# Patient Record
Sex: Female | Born: 1955 | Race: White | Hispanic: No | Marital: Married | Smoking: Never smoker
Health system: Southern US, Community
[De-identification: ages and names within clinical notes are randomized; demographics above are authoritative.]

## PROBLEM LIST (undated history)

## (undated) DIAGNOSIS — I10 Essential (primary) hypertension: Secondary | ICD-10-CM

## (undated) DIAGNOSIS — L719 Rosacea, unspecified: Secondary | ICD-10-CM

## (undated) DIAGNOSIS — E559 Vitamin D deficiency, unspecified: Secondary | ICD-10-CM

## (undated) DIAGNOSIS — E538 Deficiency of other specified B group vitamins: Secondary | ICD-10-CM

## (undated) DIAGNOSIS — R7303 Prediabetes: Secondary | ICD-10-CM

## (undated) DIAGNOSIS — R12 Heartburn: Secondary | ICD-10-CM

## (undated) DIAGNOSIS — M1990 Unspecified osteoarthritis, unspecified site: Secondary | ICD-10-CM

## (undated) DIAGNOSIS — R011 Cardiac murmur, unspecified: Secondary | ICD-10-CM

## (undated) DIAGNOSIS — K219 Gastro-esophageal reflux disease without esophagitis: Secondary | ICD-10-CM

## (undated) DIAGNOSIS — F419 Anxiety disorder, unspecified: Secondary | ICD-10-CM

## (undated) DIAGNOSIS — R55 Syncope and collapse: Secondary | ICD-10-CM

## (undated) DIAGNOSIS — Z91018 Allergy to other foods: Secondary | ICD-10-CM

## (undated) DIAGNOSIS — E119 Type 2 diabetes mellitus without complications: Secondary | ICD-10-CM

## (undated) DIAGNOSIS — D649 Anemia, unspecified: Secondary | ICD-10-CM

## (undated) DIAGNOSIS — F32A Depression, unspecified: Secondary | ICD-10-CM

## (undated) DIAGNOSIS — E039 Hypothyroidism, unspecified: Secondary | ICD-10-CM

## (undated) DIAGNOSIS — E669 Obesity, unspecified: Secondary | ICD-10-CM

## (undated) DIAGNOSIS — K920 Hematemesis: Secondary | ICD-10-CM

## (undated) HISTORY — DX: Anxiety disorder, unspecified: F41.9

## (undated) HISTORY — DX: Hematemesis: K92.0

## (undated) HISTORY — DX: Gastro-esophageal reflux disease without esophagitis: K21.9

## (undated) HISTORY — DX: Heartburn: R12

## (undated) HISTORY — DX: Depression, unspecified: F32.A

## (undated) HISTORY — DX: Deficiency of other specified B group vitamins: E53.8

## (undated) HISTORY — DX: Allergy to other foods: Z91.018

## (undated) HISTORY — DX: Syncope and collapse: R55

## (undated) HISTORY — PX: KNEE ARTHROSCOPY: SUR90

## (undated) HISTORY — DX: Unspecified osteoarthritis, unspecified site: M19.90

## (undated) HISTORY — DX: Anemia, unspecified: D64.9

## (undated) HISTORY — DX: Vitamin D deficiency, unspecified: E55.9

## (undated) HISTORY — PX: CHOLECYSTECTOMY: SHX55

## (undated) HISTORY — DX: Type 2 diabetes mellitus without complications: E11.9

## (undated) HISTORY — PX: LAPAROSCOPIC GASTRIC BANDING: SHX1100

## (undated) HISTORY — DX: Rosacea, unspecified: L71.9

## (undated) HISTORY — DX: Obesity, unspecified: E66.9

## (undated) HISTORY — PX: ABDOMINAL HYSTERECTOMY: SHX81

---

## 1998-04-04 ENCOUNTER — Emergency Department (HOSPITAL_COMMUNITY): Admission: EM | Admit: 1998-04-04 | Discharge: 1998-04-04 | Payer: Self-pay | Admitting: Emergency Medicine

## 1998-04-09 ENCOUNTER — Ambulatory Visit (HOSPITAL_COMMUNITY): Admission: RE | Admit: 1998-04-09 | Discharge: 1998-04-10 | Payer: Self-pay | Admitting: Surgery

## 2000-01-29 ENCOUNTER — Other Ambulatory Visit: Admission: RE | Admit: 2000-01-29 | Discharge: 2000-01-29 | Payer: Self-pay | Admitting: Gynecology

## 2001-04-05 ENCOUNTER — Other Ambulatory Visit: Admission: RE | Admit: 2001-04-05 | Discharge: 2001-04-05 | Payer: Self-pay | Admitting: Gynecology

## 2002-05-04 ENCOUNTER — Other Ambulatory Visit: Admission: RE | Admit: 2002-05-04 | Discharge: 2002-05-04 | Payer: Self-pay | Admitting: Gynecology

## 2003-06-13 ENCOUNTER — Other Ambulatory Visit: Admission: RE | Admit: 2003-06-13 | Discharge: 2003-06-13 | Payer: Self-pay | Admitting: Gynecology

## 2004-04-03 ENCOUNTER — Encounter: Admission: RE | Admit: 2004-04-03 | Discharge: 2004-07-02 | Payer: Self-pay | Admitting: Endocrinology

## 2004-06-13 ENCOUNTER — Other Ambulatory Visit: Admission: RE | Admit: 2004-06-13 | Discharge: 2004-06-13 | Payer: Self-pay | Admitting: Gynecology

## 2004-08-06 ENCOUNTER — Encounter: Admission: RE | Admit: 2004-08-06 | Discharge: 2004-08-06 | Payer: Self-pay | Admitting: Endocrinology

## 2004-12-23 ENCOUNTER — Encounter: Admission: RE | Admit: 2004-12-23 | Discharge: 2004-12-23 | Payer: Self-pay | Admitting: Family Medicine

## 2005-07-23 ENCOUNTER — Other Ambulatory Visit: Admission: RE | Admit: 2005-07-23 | Discharge: 2005-07-23 | Payer: Self-pay | Admitting: Gynecology

## 2006-04-23 ENCOUNTER — Encounter: Admission: RE | Admit: 2006-04-23 | Discharge: 2006-07-22 | Payer: Self-pay | Admitting: Endocrinology

## 2006-09-30 ENCOUNTER — Other Ambulatory Visit: Admission: RE | Admit: 2006-09-30 | Discharge: 2006-09-30 | Payer: Self-pay | Admitting: Gynecology

## 2007-01-01 ENCOUNTER — Ambulatory Visit (HOSPITAL_BASED_OUTPATIENT_CLINIC_OR_DEPARTMENT_OTHER): Admission: RE | Admit: 2007-01-01 | Discharge: 2007-01-01 | Payer: Self-pay | Admitting: Specialist

## 2007-05-18 ENCOUNTER — Ambulatory Visit (HOSPITAL_COMMUNITY): Admission: RE | Admit: 2007-05-18 | Discharge: 2007-05-18 | Payer: Self-pay | Admitting: General Surgery

## 2007-05-28 ENCOUNTER — Encounter: Admission: RE | Admit: 2007-05-28 | Discharge: 2007-08-26 | Payer: Self-pay | Admitting: General Surgery

## 2007-07-19 ENCOUNTER — Ambulatory Visit (HOSPITAL_COMMUNITY): Admission: RE | Admit: 2007-07-19 | Discharge: 2007-07-19 | Payer: Self-pay | Admitting: General Surgery

## 2007-07-26 ENCOUNTER — Ambulatory Visit (HOSPITAL_COMMUNITY): Admission: RE | Admit: 2007-07-26 | Discharge: 2007-07-27 | Payer: Self-pay | Admitting: General Surgery

## 2007-10-06 ENCOUNTER — Other Ambulatory Visit: Admission: RE | Admit: 2007-10-06 | Discharge: 2007-10-06 | Payer: Self-pay | Admitting: Gynecology

## 2008-01-24 ENCOUNTER — Encounter: Admission: RE | Admit: 2008-01-24 | Discharge: 2008-01-24 | Payer: Self-pay | Admitting: General Surgery

## 2008-02-17 ENCOUNTER — Encounter: Admission: RE | Admit: 2008-02-17 | Discharge: 2008-02-17 | Payer: Self-pay | Admitting: Family Medicine

## 2008-03-31 ENCOUNTER — Ambulatory Visit: Payer: Self-pay | Admitting: Internal Medicine

## 2008-03-31 DIAGNOSIS — R05 Cough: Secondary | ICD-10-CM

## 2008-03-31 DIAGNOSIS — R059 Cough, unspecified: Secondary | ICD-10-CM | POA: Insufficient documentation

## 2008-04-10 ENCOUNTER — Telehealth: Payer: Self-pay | Admitting: Internal Medicine

## 2008-04-10 ENCOUNTER — Ambulatory Visit: Payer: Self-pay | Admitting: Internal Medicine

## 2008-04-12 ENCOUNTER — Telehealth (INDEPENDENT_AMBULATORY_CARE_PROVIDER_SITE_OTHER): Payer: Self-pay | Admitting: *Deleted

## 2008-04-18 ENCOUNTER — Ambulatory Visit: Payer: Self-pay | Admitting: Internal Medicine

## 2008-06-21 ENCOUNTER — Encounter: Payer: Self-pay | Admitting: Internal Medicine

## 2008-09-20 ENCOUNTER — Ambulatory Visit (HOSPITAL_COMMUNITY): Admission: RE | Admit: 2008-09-20 | Discharge: 2008-09-20 | Payer: Self-pay | Admitting: Gastroenterology

## 2009-06-28 ENCOUNTER — Encounter: Admission: RE | Admit: 2009-06-28 | Discharge: 2009-06-28 | Payer: Self-pay | Admitting: General Surgery

## 2010-05-16 ENCOUNTER — Ambulatory Visit: Payer: Self-pay | Admitting: Internal Medicine

## 2010-11-11 ENCOUNTER — Other Ambulatory Visit: Payer: Self-pay | Admitting: Internal Medicine

## 2010-11-11 ENCOUNTER — Ambulatory Visit
Admission: RE | Admit: 2010-11-11 | Discharge: 2010-11-11 | Payer: Self-pay | Source: Home / Self Care | Attending: Internal Medicine | Admitting: Internal Medicine

## 2010-11-11 DIAGNOSIS — R55 Syncope and collapse: Secondary | ICD-10-CM

## 2010-11-11 DIAGNOSIS — K920 Hematemesis: Secondary | ICD-10-CM | POA: Insufficient documentation

## 2010-11-11 DIAGNOSIS — K5289 Other specified noninfective gastroenteritis and colitis: Secondary | ICD-10-CM | POA: Insufficient documentation

## 2010-11-11 DIAGNOSIS — K219 Gastro-esophageal reflux disease without esophagitis: Secondary | ICD-10-CM | POA: Insufficient documentation

## 2010-11-11 DIAGNOSIS — R112 Nausea with vomiting, unspecified: Secondary | ICD-10-CM | POA: Insufficient documentation

## 2010-11-11 HISTORY — DX: Syncope and collapse: R55

## 2010-11-11 HISTORY — DX: Gastro-esophageal reflux disease without esophagitis: K21.9

## 2010-11-11 HISTORY — DX: Hematemesis: K92.0

## 2010-11-11 LAB — HEPATIC FUNCTION PANEL
ALT: 22 U/L (ref 0–35)
AST: 23 U/L (ref 0–37)
Albumin: 3.7 g/dL (ref 3.5–5.2)
Alkaline Phosphatase: 46 U/L (ref 39–117)
Bilirubin, Direct: 0.2 mg/dL (ref 0.0–0.3)
Total Bilirubin: 1.9 mg/dL — ABNORMAL HIGH (ref 0.3–1.2)
Total Protein: 7 g/dL (ref 6.0–8.3)

## 2010-11-11 LAB — CBC WITH DIFFERENTIAL/PLATELET
Basophils Absolute: 0 10*3/uL (ref 0.0–0.1)
Basophils Relative: 0.3 % (ref 0.0–3.0)
Eosinophils Absolute: 0.2 10*3/uL (ref 0.0–0.7)
Eosinophils Relative: 1.7 % (ref 0.0–5.0)
HCT: 36.6 % (ref 36.0–46.0)
Hemoglobin: 12.4 g/dL (ref 12.0–15.0)
Lymphocytes Relative: 17.6 % (ref 12.0–46.0)
Lymphs Abs: 1.8 10*3/uL (ref 0.7–4.0)
MCHC: 34 g/dL (ref 30.0–36.0)
MCV: 88.6 fl (ref 78.0–100.0)
Monocytes Absolute: 0.5 10*3/uL (ref 0.1–1.0)
Monocytes Relative: 5 % (ref 3.0–12.0)
Neutro Abs: 7.7 10*3/uL (ref 1.4–7.7)
Neutrophils Relative %: 75.4 % (ref 43.0–77.0)
Platelets: 297 10*3/uL (ref 150.0–400.0)
RBC: 4.13 Mil/uL (ref 3.87–5.11)
RDW: 14.3 % (ref 11.5–14.6)
WBC: 10.2 10*3/uL (ref 4.5–10.5)

## 2010-11-11 LAB — BASIC METABOLIC PANEL
BUN: 14 mg/dL (ref 6–23)
CO2: 28 mEq/L (ref 19–32)
Calcium: 9.8 mg/dL (ref 8.4–10.5)
Chloride: 102 mEq/L (ref 96–112)
Creatinine, Ser: 0.7 mg/dL (ref 0.4–1.2)
GFR: 98.8 mL/min (ref >60.00)
Glucose, Bld: 90 mg/dL (ref 70–99)
Potassium: 4.9 mEq/L (ref 3.5–5.1)
Sodium: 139 mEq/L (ref 135–145)

## 2010-11-18 ENCOUNTER — Other Ambulatory Visit: Payer: Self-pay | Admitting: General Surgery

## 2010-11-20 ENCOUNTER — Ambulatory Visit
Admission: RE | Admit: 2010-11-20 | Discharge: 2010-11-20 | Disposition: A | Discharge status: Home / Self Care | Payer: Self-pay | Source: Ambulatory Visit | Attending: General Surgery | Admitting: General Surgery

## 2010-11-21 NOTE — Progress Notes (Signed)
Summary: cough  Phone Note Call from Patient Call back at [phone]   Caller: Patient Call For: parrett Summary of Call: need  tramadzol hcl 50 mg for cough [facility] Initial call taken by: Rickard Patience,  April 12, 2008 8:44 AM  Follow-up for Phone Call        Spoke with pt and made aware that rx was refilled.  She was also advised to keep her followup with Dr Sherene Sires. Follow-up by: Vernie Murders,  April 12, 2008 11:33 AM      Prescriptions: TRAMADOL HCL 50 MG  TABS (TRAMADOL HCL) One to two by mouth every 4-6 hours  #40 x 0   Entered by:   Vernie Murders   Authorized by:   Nyoka Cowden MD   Signed by:   Vernie Murders on 04/12/2008   Method used:   [facility] to ...       [id number]       [address]       [place], [place]  [address]       Ph: [phone] or [phone]       Fax: [phone]   RxID:   [id number]

## 2010-11-21 NOTE — Assessment & Plan Note (Signed)
Summary: rov 1-2 wks////kp   Referred by:  Dr Angelia Mould PCP:  [facility]  Chief Complaint:  .Marland Kitchen..Marland Kitchenoffice visit......Marland KitchenGERD.......coughing...Marland KitchenMarland Kitchenfu visit.....Marland Kitchen  History of Present Illness: 74 yowf never smoker with seasonal rhinitis and asthma  and hives most in spring took allergy shots until 1996  since then controlled with allegra occ until Feb 09 came down head cold > chest with persistent intermittently prod cough since then went to doctor after one month rx abx then ct of head 02/17/08 -   max sinsitis with small af level minimal mucus prod no fever but felt a little better except for cough = worse after supper, looses voice, worse when lies down and early in am, coughs to gag and vomit.    6/12 given avelox and prednisone taper. Sx better but still coughing up green. CXR 6/12 with  increased marking RML  seen 6/23  another round of avelox/pred some better but not able to lie down even if takes 2 tramadol.  Returns 6/30 focused on her inability to cough up "fluid in lungs" not taking mucinexdm as instructed and mucus turning green intermittently, esp when lies down and early in am  Pt denies any significant sore throat, nasal congestion, fever, chills, sweats, unintended wt loss, pleuritic or exertional cp, orthopnea pnd or leg swelling.  Pt also denies any obvious fluctuation in symptoms with weather or environmental change or other alleviating or aggravating factors.            Current Allergies: ! PCN ! TETRACYCLINE ! MORPHINE ! CECLOR  Past Medical History:    Reviewed history from 03/31/2008 and no changes required:       Allergies seasonal          Past Surgical History:    Reviewed history from 03/31/2008 and no changes required:       Lap Band surgery October 2008   Family History:    Reviewed history from 03/31/2008 and no changes required:       Paternal grandfather had emphesema       Father has and "black lung"       Paternal grandmother had asthma  Social  History:    Reviewed history from 03/31/2008 and no changes required:       Works as a Print production planner       Married with one child       Never smoked       Alcohol on occ.    Review of Systems  The patient denies anorexia, fever, weight loss, weight gain, vision loss, decreased hearing, hoarseness, chest pain, syncope, peripheral edema, headaches, hemoptysis, abdominal pain, melena, hematochezia, severe indigestion/heartburn, hematuria, incontinence, muscle weakness, transient blindness, difficulty walking, depression, unusual weight change, abnormal bleeding, and enlarged lymph nodes.     Vital Signs:  Patient Profile:   55 Years Old Female Height:     71 inches Weight:      231.13 pounds BMI:     32.35 O2 Sat:      94 % O2 treatment:    Room Air Temp:     97.9 degrees F oral Pulse rate:   66 / minute BP sitting:   120 / 78  (left arm) Cuff size:   regular  Pt. in pain?   no  Vitals Entered By: Clarise Cruz Duncan Dull) (April 18, 2008 12:10 PM)                  Physical Exam  ambulatory depressed -appearing  white female in  no acute distress with frequent  throat clearing during interview and exam Afeb with normal vital signs HEENT: nl dentition,, and orophanx. Nl external ear canals without cough reflex -moderate right greater than left turbinate edema with mucopurulent secretions present. Neck without JVD/Nodes/TM Lungs clear to A and P bilaterally without cough on insp or exp maneuvers RRR no s3 or murmur or increase in P2 Abd soft and benign with nl excursion in the supine position. No bruits or organomegaly Ext warm without calf tenderness, cyanosis clubbing or edema Skin warm and dry without lesions      Pulmonary Function Test Height (in.): 71 Gender: Female    Impression & Recommendations:  Problem # 1:  COUGH (ICD-786.2) She clearly improves, though not completely after courses of abx but still has a nagging cough that causes gagging and  vomiting.  Normally I would rec adding  augmentin x 21 days to address what is likely underlying rhinitis and sinusitis but can't take either of these and developing a cyclical pattern that is not likely to be corrected until the sinus are cleared, and when/if they are would use Tussicaps to address this issue.   Each maintenance medication was reviewed in detail including most importantly the difference between maintenance and as needed and under what circumstances the prns are to be used.   ENT referral arranged.  After ent eval needs to return here for fu cxr, will place in tickel file for this purpose  Orders: ENT Referral (ENT) Est. Patient Level IV ([address])   Medications Added to Medication List This Visit: 1)  Mucinex Dm 30-600 Mg Tb12 (Dextromethorphan-guaifenesin) .... 2 every 12 hours until no longer coughing at all 2)  Methylprednisolone 4 Mg Tabs (Methylprednisolone) .... Take 4x2, 3x2 2x2 1x2 3)  Levaquin 750 Mg Tabs (Levofloxacin) .... One tablet by mouth daily x 10 days  Complete Medication List: 1)  Omeprazole 20 Mg Cpdr (Omeprazole) .... Take one 30-60 min before first and last meals of the day 2)  Reglan 10 Mg Tabs (Metoclopramide hcl) .... 1/2 three times a day before meal and 1 at bedtime 3)  Mucinex Dm 30-600 Mg Tb12 (Dextromethorphan-guaifenesin) .... 2 every 12 hours until no longer coughing at all 4)  Nasonex 50 Mcg/act Susp (Mometasone furoate) .... 2 sprays each nostril as needed 5)  Centrum Silver Tabs (Multiple vitamins-minerals) 6)  Tylenol Extra Strength 500 Mg Tabs (Acetaminophen) .... As needed 7)  Tramadol Hcl 50 Mg Tabs (Tramadol hcl) .... One to two by mouth every 4-6 hours 8)  Tessalon 200 Mg Caps (Benzonatate) .Marland Kitchen.. 1 by mouth three times a day as needed cough 9)  Methylprednisolone 4 Mg Tabs (Methylprednisolone) .... Take 4x2, 3x2 2x2 1x2 10)  Levaquin 750 Mg Tabs (Levofloxacin) .... One tablet by mouth daily x 10 days   Patient Instructions: 1)   GERD (gastroesophageal reflux disease) was discussed. It is a common cause of respiratory symptoms. It commonly presents in the absence of heartburn. GERD can be treated with medication, but also with lifestyle changes including avoidance of late meals, excessive alcohol, smoking cessation, and avoid fatty foods, chocolate, peppermint, colas, red wine, and acidic juices such as orange juice.  2)  NO MINT OR MENTHOL PRODUCTs 3)  USE SUGARLESS CANDY INSTEAD (jolley ranchers)  4)  SEE [facility] FOR REFERRAL TO ENT   Prescriptions: TRAMADOL HCL 50 MG  TABS (TRAMADOL HCL) One to two by mouth every 4-6 hours  #40 x 0   Entered and Authorized by:  Nyoka Cowden MD   Signed by:   Nyoka Cowden MD on 04/18/2008   Method used:   [facility] ...       [id number]       [address]       [place], [place]  [address]       Ph: [phone] or [phone]       Fax: [phone]   RxID:   [id number] LEVAQUIN 750 MG  TABS (LEVOFLOXACIN) One tablet by mouth daily x 10 days  #10 x 0   Entered and Authorized by:   Nyoka Cowden MD   Signed by:   Nyoka Cowden MD on 04/18/2008   Method used:   Electronically sent to ...       [id number]       [address]       [place], [place]  [address]       Ph: [phone] or [phone]       Fax: [phone]   RxID:   [id number] METHYLPREDNISOLONE 4 MG  TABS (METHYLPREDNISOLONE) take 4x2, 3x2 2x2 1x2  #20 x 0   Entered and Authorized by:   Nyoka Cowden MD   Signed by:   Nyoka Cowden MD on 04/18/2008   Method used:   Electronically sent to ...       [facility] [address]  [id number]       [address]       [place], [place]  [address]       Ph: [phone] or [phone]       Fax: [phone]   RxID:   [id number]  ]  Appended Document: rov 1-2 wks////kp fax copy to [facility]l ask pt to increase her reglan to a whole  pill ac and hs to see if this helps her gagging and vomiting - this will need to be stopped first once the cough is under control without the need for tramadol  Appended Document: rov 1-2 wks////kp Copy was faxed to [facility].  Spoke with pt and made aware to increase the reglan to 1 whole pill instread of half before meals and at bedtime.

## 2010-11-21 NOTE — Progress Notes (Signed)
Summary: cough/ rx req  Phone Note Call from Patient   Caller: Patient Call For: wert Summary of Call: pt saw mw 6/12. has a cough w/ green phlegm. [facility] [address] cell# pt. [phone]. Initial call taken by: Tivis Ringer,  April 10, 2008 9:08 AM  Follow-up for Phone Call        appt made for today at 4:00pm. Pt feels she can't wait til her appt in Friday with Wert.  Follow-up by: Reynaldo Minium CMA,  April 10, 2008 9:37 AM

## 2010-11-21 NOTE — Assessment & Plan Note (Signed)
Summary: cough/throat trouble/kcw   Referred by:  Dr Angelia Mould PCP:  [facility]  Chief Complaint:  Cough.  History of Present Illness: follow up   51 yowf never smoker with seasonal rhinitis and asthma  and hives most in spring took allergy shots until 1996  since then controlled with allegra occ until Feb 09 came down head cold > chest with persistent intermittently prod cough since then went to doctor after one month rx abx then abx then ct of head 02/17/08 -   max sinsitis with small af level minimal mucus prod no fever but felt a little better except for cough = worse after supper, looses voice, worse when lies down and early in am, coughs to gag and voimit.  6/12 given avelox and prednisone taper. Sx better but still coughing up green. CXR 6/12 with  increased marking RML,. Denies chest pain, dyspnea, orthopnea, hemoptysis, fever, n/v/d, edema. Not using tramadol on regular basis, complains that "too sleepy" , feels tired on reglan. not using mucinex dm on reg. basis either. cough still quite severe at times.       April 11, 2008 Current Meds:  OMEPRAZOLE 20 MG  CPDR (OMEPRAZOLE) Take one 30-60 min before first and last meals of the day REGLAN 10 MG  TABS (METOCLOPRAMIDE HCL) 1/2 three times a day before meal and 1 at bedtime MUCINEX DM 30-600 MG  TB12 (DEXTROMETHORPHAN-GUAIFENESIN) 1-2 every 12 hours NASONEX 50 MCG/ACT  SUSP (MOMETASONE FUROATE) 2 sprays each nostril as needed CENTRUM SILVER   TABS (MULTIPLE VITAMINS-MINERALS)  TYLENOL EXTRA STRENGTH 500 MG  TABS (ACETAMINOPHEN) as needed TRAMADOL HCL 50 MG  TABS (TRAMADOL HCL) One to two by mouth every 4-6 hours TESSALON 200 MG  CAPS (BENZONATATE) 1 by mouth three times a day as needed cough         Prior Medication List:  OMEPRAZOLE 20 MG  CPDR (OMEPRAZOLE) Take one 30-60 min before first and last meals of the day REGLAN 10 MG  TABS (METOCLOPRAMIDE HCL) By mouth before each meal and at bedtime. MUCINEX DM 30-600 MG  TB12  (DEXTROMETHORPHAN-GUAIFENESIN) 1-2 every 12 hours NASONEX 50 MCG/ACT  SUSP (MOMETASONE FUROATE) 2 sprays each nostril as needed CENTRUM SILVER   TABS (MULTIPLE VITAMINS-MINERALS)  TYLENOL EXTRA STRENGTH 500 MG  TABS (ACETAMINOPHEN) as needed TRAMADOL HCL 50 MG  TABS (TRAMADOL HCL) One to two by mouth every 4-6 hours PREDNISONE 10 MG  TABS (PREDNISONE) 4 each am x 2days, 2x2days, 1x2days and stop AVELOX 400 MG  TABS (MOXIFLOXACIN HCL) By mouth daily   Updated Prior Medication List: OMEPRAZOLE 20 MG  CPDR (OMEPRAZOLE) Take one 30-60 min before first and last meals of the day REGLAN 10 MG  TABS (METOCLOPRAMIDE HCL) 1/2 three times a day before meal and 1 at bedtime MUCINEX DM 30-600 MG  TB12 (DEXTROMETHORPHAN-GUAIFENESIN) 1-2 every 12 hours NASONEX 50 MCG/ACT  SUSP (MOMETASONE FUROATE) 2 sprays each nostril as needed CENTRUM SILVER   TABS (MULTIPLE VITAMINS-MINERALS)  TYLENOL EXTRA STRENGTH 500 MG  TABS (ACETAMINOPHEN) as needed TRAMADOL HCL 50 MG  TABS (TRAMADOL HCL) One to two by mouth every 4-6 hours TESSALON 200 MG  CAPS (BENZONATATE) 1 by mouth three times a day as needed cough  Current Allergies (reviewed today): ! PCN ! TETRACYCLINE ! MORPHINE ! 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   Risk Factors:   Review of Systems      See HPI   Vital Signs:  Patient Profile:   55 Years Old Female Weight:      235 pounds O2 Sat:      95 % O2 treatment:    Room Air Temp:     98.4 degrees F oral Pulse rate:   72 /  minute BP sitting:   134 / 88  (right arm)  Vitals Entered By: Cloyde Reams RN (April 10, 2008 4:00 PM)             Comments Pt is here today for an acute ov for productive cough, green phlegm onset 04/05/08 unable to la down at night having to sit up to sleep.  Unable to eat d/t cough causing throat irritation and nausea. Medications reviewed Completed course of prednisone and Avelox rx on 6/12/09Erika [facility] RN  April 10, 2008 4:06 PM      Physical Exam  ambulatory pleasant white female in no acute distress with frequent  throat clearing during interview and exam Afeb with normal vital signs HEENT: nl dentition,, and orophanx. Nl external ear canals without cough reflex -moderate right greater than left turbinate edema with mucopurulent secretions present. Neck without JVD/Nodes/TM Lungs clear to A and P bilaterally without cough on insp or exp maneuvers RRR no s3 or murmur or increase in P2 Abd soft and benign with nl excursion in the supine position. No bruits or organomegaly Ext warm without calf tenderness, cyanosis clubbing or edema Skin warm and dry without lesions       Impression & Recommendations:  Problem # 1:  COUGH (ICD-786.2) Slow to resolve bronchitis with cyclical cough. Possible PNA with increased marking in rml, will repeat CXR.  Avelox 400mg  once daily for 7days Mucinex DM 2 two times a day as needed cough  Tessalon Perles three times a day as needed still coughing  Tramadol 50mg  1-2 every 4 hr as needed breakthrough coughing GOAL is to be cough free and throat clearing free Sugarless candy to avoid throat clearing and coughing NO MINTS Decrease Reglan 10mg  1/2 three times a day before meal and then 1 at bedtime.  follow up Dr. Sherene Sires 1-2 weeks and as needed  Please contact office for sooner follow up if symptoms do not improve or worsen  Orders: T-2 View CXR, Same Day (71020.5TC) Est. Patient Level IV ([address])   Medications Added to Medication List This  Visit: 1)  Reglan 10 Mg Tabs (Metoclopramide hcl) .... 1/2 three times a day before meal and 1 at bedtime 2)  Tessalon 200 Mg Caps (Benzonatate) .Marland Kitchen.. 1 by mouth three times a day as needed cough   Patient Instructions: 1)  Avelox 400mg  once daily for 7days 2)  Mucinex DM 2 two times a day as needed cough  3)  Tessalon Perles three times a day as needed still coughing  4)  Tramadol 50mg  1-2 every 4  hr as needed breakthrough coughing 5)  GOAL is to be cough free and throat clearing free 6)  Sugarless candy to avoid throat clearing and coughing 7)  NO MINTS 8)  Decrease Reglan 10mg  1/2 three times a day before meal and then 1 at bedtime.  9)  follow up Dr. Sherene Sires 1-2 weeks and as needed  10)  Please contact office for sooner follow up if symptoms do not improve or worsen    Prescriptions: TESSALON 200 MG  CAPS (BENZONATATE) 1 by mouth three times a day as needed cough  #45 x 1   Entered and Authorized by:   Rubye Oaks NP   Signed by:   Tammy Parrett NP on 04/10/2008   Method used:   [facility] to ...       [id number]       [address]       [place], [place]  [address]       Ph: [phone] or [phone]       Fax: [phone]   RxID:   [id number]  ]

## 2010-11-21 NOTE — Assessment & Plan Note (Signed)
Summary: TO BE EST/NJR   Vital Signs:  Patient profile:   55 year old female Height:      68 inches Weight:      274 pounds BMI:     41.81 Temp:     98.1 degrees F oral Pulse rate:   68 / minute Pulse rhythm:   regular Resp:     16 per minute BP sitting:   120 / 70  (right arm)  Vitals Entered By: Duard Brady LPN (May 16, 2010 2:05 PM) CC: new to establish - pcp retired Is Patient Diabetic? No   Primary Care Provider:  [facility]  CC:  new to establish - pcp retired.  History of Present Illness: 55 year old patient who is seen today to establish with our practice.  Medical problems include seasonal allergic rhinitis and exogenous obesity  Preventive Screening-Counseling & Management  Alcohol-Tobacco     Smoking Status: never  Allergies: 1)  ! Pcn 2)  ! Tetracycline 3)  ! Morphine 4)  ! Ceclor  Past History:  Past Medical History: Allergies seasonal  exogenous obesity  history of type 2 diabetes  Past Surgical History: Lap Band surgery October 2008 Hysterectomy- completes 1993 Cholecystectomy- 1995 left knee arthroscopic surgery  Colonoscopy in November 2009  Family History: Reviewed history from 03/31/2008 and no changes required. Paternal grandfather had emphesema Father has and "black lung" Paternal grandmother had asthma  mother died in age 31, complications of senile dementia one brother two sisters one sister had advanced mental retardation and died of complications of cerebral vascular disease  Social History: Reviewed history from 03/31/2008 and no changes required. 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  The patient denies anorexia, fever, weight loss, weight gain, vision loss, decreased hearing, hoarseness, chest pain, syncope, dyspnea on exertion, peripheral edema, prolonged cough, headaches,  hemoptysis, abdominal pain, melena, hematochezia, hematuria, incontinence, genital sores, muscle weakness, suspicious skin lesions, transient blindness, difficulty walking, depression, unusual weight change, abnormal bleeding, enlarged lymph nodes, angioedema, and breast masses.    Physical Exam  General:  overweight-appearing.  120/72 Head:  Normocephalic and atraumatic without obvious abnormalities. No apparent alopecia or balding. Eyes:  No corneal or conjunctival inflammation noted. EOMI. Perrla. Funduscopic exam benign, without hemorrhages, exudates or papilledema. Vision grossly normal. Ears:  External ear exam shows no significant lesions or deformities.  Otoscopic examination reveals clear canals, tympanic membranes are intact bilaterally without bulging, retraction, inflammation or discharge. Hearing is grossly normal bilaterally. Nose:  External nasal examination shows no deformity or inflammation. Nasal mucosa are pink and moist without lesions or exudates. Mouth:  Oral mucosa and oropharynx without lesions or exudates.  Teeth in good repair. Neck:  No deformities, masses, or tenderness noted. Chest Wall:  No deformities, masses, or tenderness noted. Lungs:  Normal respiratory effort, chest expands symmetrically. Lungs are clear to auscultation, no crackles or wheezes. Heart:  Normal rate and regular rhythm. S1 and S2 normal without gallop, murmur, click, rub or other extra sounds. Abdomen:  Bowel sounds positive,abdomen soft and non-tender without masses, organomegaly or hernias noted. Msk:  No deformity or scoliosis noted of thoracic or lumbar spine.   Pulses:  R and L carotid,radial,femoral,dorsalis pedis and posterior tibial pulses are full and equal bilaterally Extremities:  No clubbing, cyanosis, edema, or deformity noted with normal full range of motion of all joints.  Neurologic:  alert & oriented X3, cranial nerves II-XII intact, strength normal in all extremities, and gait  normal.   Skin:  Intact without suspicious lesions or rashes Cervical Nodes:  No lymphadenopathy noted Axillary Nodes:  No palpable lymphadenopathy Inguinal Nodes:  No significant adenopathy Psych:  Cognition and judgment appear intact. Alert and cooperative with normal attention span and concentration. No apparent delusions, illusions, hallucinations   Impression & Recommendations:  Problem # 1:  Preventive Health Care (ICD-V70.0)  Complete Medication List: 1)  Nasonex 50 Mcg/act Susp (Mometasone furoate) .... 2 sprays each nostril as needed 2)  Centrum Silver Tabs (Multiple vitamins-minerals) 3)  Tylenol Extra Strength 500 Mg Tabs (Acetaminophen) .... As needed 4)  Bi-weekly Allergy Injections  5)  Furosemide 40 Mg Tabs (Furosemide) .... One daily, as needed for edema  Patient Instructions: 1)  Please schedule a follow-up appointment in 1 year. 2)  Limit your Sodium (Salt). 3)  It is important that you exercise regularly at least 20 minutes 5 times a week. If you develop chest pain, have severe difficulty breathing, or feel very tired , stop exercising immediately and seek medical attention. 4)  You need to lose weight. Consider a lower calorie diet and regular exercise.  5)  Take calcium +Vitamin D daily. Prescriptions: FUROSEMIDE 40 MG TABS (FUROSEMIDE) one daily, as needed for edema  #50 x 3   Entered and Authorized by:   Gordy Savers  MD   Signed by:   Gordy Savers  MD on 05/16/2010   Method used:   [facility]        [phone]       [address]       [place], [place]  [address]       Ph: [phone] or [phone]       Fax: [phone]   RxID:   [id number]

## 2010-11-21 NOTE — Assessment & Plan Note (Signed)
Summary: heartburn/cb   Vital Signs:  Patient profile:   55 year old female Temp:     98.3 degrees F oral BP sitting:   128 / 94  (left arm) Cuff size:   regular  Vitals Entered By: Duard Brady LPN (November 11, 2010 11:10 AM) CC: c/o GERD sx - coughing - occ blood - now noting block emesis that has a coffe ground appreance Is Patient Diabetic? No   Primary Care Provider:  Kindl  CC:  c/o GERD sx - coughing - occ blood - now noting block emesis that has a coffe ground appreance.  History of Present Illness: 55 year old patient who is approximately 3 years status post lab band surgery for a morbid obesity.  Unfortunately, she has done  poorly since the  surgery with  fairly chronic nausea, vomiting, and significant GERD  symptoms.  She is on a b.i.d. regimen of Prilosec and has had her lap band adjusted on multiple occasions.  She has had frequent episodes of low-grade coffee ground emesis.  For the past several days.  She has had more refractory nausea and vomiting and has been on clear liquids.  Yesterday morning, she had an episode of vasovagal syncope associated with the vomiting and nausea.  She has considered a LAP-BAND reversal in the past, but she was told that this would be as expensive as the original surgery, and would not be covered by insurance.   Allergies: 1)  ! Pcn 2)  ! Tetracycline 3)  ! Morphine 4)  ! Ceclor  Past History:  Past Medical History: Allergies seasonal  exogenous obesity  history of type 2 diabetes GERD  Past Surgical History: Reviewed history from 05/16/2010 and no changes required. Lap Band surgery October 2008 Hysterectomy- completes 1993 Cholecystectomy- 1995 left knee arthroscopic surgery  Colonoscopy in November 2009  Review of Systems       The patient complains of anorexia and abdominal pain.  The patient denies weight loss, weight gain, vision loss, decreased hearing, hoarseness, chest pain, syncope, dyspnea on exertion,  peripheral edema, prolonged cough, headaches, hemoptysis, melena, hematochezia, severe indigestion/heartburn, hematuria, incontinence, genital sores, muscle weakness, suspicious skin lesions, transient blindness, difficulty walking, depression, unusual weight change, abnormal bleeding, enlarged lymph nodes, angioedema, and breast masses.    Physical Exam  General:  obese no acute distress.  Blood pressure 130/90 Head:  Normocephalic and atraumatic without obvious abnormalities. No apparent alopecia or balding. Eyes:  No corneal or conjunctival inflammation noted. EOMI. Perrla. Funduscopic exam benign, without hemorrhages, exudates or papilledema. Vision grossly normal. Ears:  External ear exam shows no significant lesions or deformities.  Otoscopic examination reveals clear canals, tympanic membranes are intact bilaterally without bulging, retraction, inflammation or discharge. Hearing is grossly normal bilaterally. Mouth:  Oral mucosa and oropharynx without lesions or exudates.  Teeth in good repair. Neck:  No deformities, masses, or tenderness noted. Lungs:  Normal respiratory effort, chest expands symmetrically. Lungs are clear to auscultation, no crackles or wheezes. Heart:  Normal rate and regular rhythm. S1 and S2 normal without gallop, murmur, click, rub or other extra sounds. Abdomen:  Bowel sounds positive,abdomen soft and non-tender without masses, organomegaly or hernias noted. Msk:  No deformity or scoliosis noted of thoracic or lumbar spine.   Pulses:  R and L carotid,radial,femoral,dorsalis pedis and posterior tibial pulses are full and equal bilaterally Extremities:  No clubbing, cyanosis, edema, or deformity noted with normal full range of motion of all joints.     Impression & Recommendations:  Problem # 1:  COUGH (ICD-786.2)  Problem # 2:  HEMATEMESIS (ICD-578.0)  Orders: Venipuncture (60454) TLB-BMP (Basic Metabolic Panel-BMET) (80048-METABOL) TLB-CBC Platelet -  w/Differential (85025-CBCD) TLB-Hepatic/Liver Function Pnl (80076-HEPATIC)  Problem # 5:  VASOVAGAL SYNCOPE (ICD-780.2)  Orders: Venipuncture (09811) TLB-BMP (Basic Metabolic Panel-BMET) (80048-METABOL) TLB-CBC Platelet - w/Differential (85025-CBCD) TLB-Hepatic/Liver Function Pnl (80076-HEPATIC) Specimen Handling (91478)  Complete Medication List: 1)  Nasonex 50 Mcg/act Susp (Mometasone furoate) .... 2 sprays each nostril as needed 2)  Centrum Silver Tabs (Multiple vitamins-minerals) 3)  Tylenol Extra Strength 500 Mg Tabs (Acetaminophen) .... As needed 4)  Bi-weekly Allergy Injections  5)  Furosemide 40 Mg Tabs (Furosemide) .... One daily, as needed for edema  Patient Instructions: 1)  Avoid foods high in acid (tomatoes, citrus juices, spicy foods). Avoid eating within two hours of lying down or before exercising. Do not over eat; try smaller more frequent meals. Elevate head of bed twelve inches when sleeping. 2)  It is important that you exercise regularly at least 20 minutes 5 times a week. If you develop chest pain, have severe difficulty breathing, or feel very tired , stop exercising immediately and seek medical attention. 3)  follow up General Surgery   Orders Added: 1)  Est. Patient Level IV [29562] 2)  Venipuncture [36415] 3)  TLB-BMP (Basic Metabolic Panel-BMET) [80048-METABOL] 4)  TLB-CBC Platelet - w/Differential [85025-CBCD] 5)  TLB-Hepatic/Liver Function Pnl [80076-HEPATIC] 6)  Specimen Handling [99000]

## 2010-11-21 NOTE — Letter (Signed)
SummaryScience writer Pulmonary Care Appointment Letter  [facility]  [address]   [place], [place] [address]   Phone: [phone]  Fax: [phone]    06/21/2008 MRN: [id number]  [facility] [address] [place], [place]  [address]  Dear Kayla [name],   Our office is attempting to contact you about an appointment.  Please call our office at [phone] to schedule this appointment with Dr.________________.  Our registration staff is prepared to assist you with any questions you may have.    Thank you,   [facility]   Appended Document: [facility] Appointment Letter atc pt x 3 to sched rov with cxr per flag sent from [facility].  Letter will be mailed to pt to call and sched this appt.

## 2010-11-21 NOTE — Assessment & Plan Note (Signed)
Summary: persistant cough/ sob////kp   Visit Type:  Initial Consult Referred by:  Dr Angelia Mould PCP:  Kindl  Chief Complaint:  Pulmonary consult to evaluate persistant cough.  .  History of Present Illness: 50 yowf never smoker with seasonal rhinitis and asthma  and hives most in spring took allergy shots until 1996  since then controlled with allegra occ until Feb 09 came down head cold > chest with persistent intermittently prod cough since then went to doctor after one month rx abx then abx then ct of head 02/17/08 -  l max sinsitis with small af level minimal mucus prod no fever but felt a little better except for cough = worse after supper, looses voice, worse when lies down and early in am, coughs to gag and voimit.  Pt denies any significant sore throat, nasal congestion or excess secretions, fever, chills, sweats, unintended wt loss, pleuritic or exertional cp, orthopnea pnd or leg swelling.  Pt also denies any obvious fluctuation in symptoms with weather or environmental change or other alleviating or aggravating factors.         6/12  Current Meds:  NASONEX 50 MCG/ACT  SUSP (MOMETASONE FUROATE) 2 sprays each nostril as needed TYLENOL EXTRA STRENGTH 500 MG  TABS (ACETAMINOPHEN) as needed CENTRUM SILVER   TABS (MULTIPLE VITAMINS-MINERALS)        Current Allergies (reviewed today): ! PCN ! TETRACYCLINE ! MORPHINE ! CECLOR  Past Medical History:    Allergies seasonal       Past Surgical History:    Lap Band surgery October 2008   Family History:    Paternal grandfather had emphesema    Father has and "black lung"    Paternal grandmother had asthma  Social History:    Works as a Print production planner    Married with one child    Never smoked    Alcohol on occ.    Review of Systems  The patient denies anorexia, fever, weight loss, weight gain, vision loss, decreased hearing, hoarseness, chest pain, syncope, peripheral edema, hemoptysis, abdominal pain, melena,  hematochezia, hematuria, incontinence, muscle weakness, transient blindness, difficulty walking, depression, unusual weight change, abnormal bleeding, and enlarged lymph nodes.         intermittent severe heartburn and indigestion   Vital Signs:  Patient Profile:   55 Years Old Female Weight:      235 pounds O2 Sat:      94 % O2 treatment:    Room Air Temp:     97.7 degrees F oral Pulse rate:   86 / minute BP sitting:   110 / 58  (left arm)  Vitals Entered By: Vernie Murders (March 31, 2008 1:41 PM)                 Physical Exam  ambulatory pleasant white female in no acute distress with occasional throat clearing during interview and exam Afeb with normal vital signs HEENT: nl dentition,, and orophanx. Nl external ear canals without cough reflex -moderate right greater than left turbinate edema with mucopurulent secretions present. Neck without JVD/Nodes/TM Lungs clear to A and P bilaterally without cough on insp or exp maneuvers RRR no s3 or murmur or increase in P2 Abd soft and benign with nl excursion in the supine position. No bruits or organomegaly Ext warm without calf tenderness, cyanosis clubbing or edema Skin warm and dry without lesions     CXR  Procedure date:  02/17/2008  Findings:      increased markings  in the right middle lobe    Impression & Recommendations: Of the three most common causes of chronic cough, only one can actually cause the other two and perpetuate the cylce of cough inducing airway trauma, inflammation, heightened sensitivity to reflux which is prompted by the cough itself via a cyclical mechanism.  This may partially respond to steroids and look like asthma and post nasal drainage but never erradicated completely unless the cough and the secondary reflux are eliminated, preferably both at the same time.     In her particular case I believe what is feeling a cough his sinus disease but what is perpetuated across his reflux and she coughs  hard she gags and vomits.  I explained the nature of a cyclical cough to her emphasizing elimination of both the cough the reflux and inflammation all at once while treating the underlying sinus problem with Avelox and order a day for 5 days since she is allergic to penicillin  Medications Added to Medication List This Visit: 1)  Omeprazole 20 Mg Cpdr (Omeprazole) .... Take one 30-60 min before first and last meals of the day 2)  Reglan 10 Mg Tabs (Metoclopramide hcl) .... By mouth before each meal and at bedtime. 3)  Mucinex Dm 30-600 Mg Tb12 (Dextromethorphan-guaifenesin) .Marland Kitchen.. 1-2 every 12 hours 4)  Nasonex 50 Mcg/act Susp (Mometasone furoate) .... 2 sprays each nostril as needed 5)  Centrum Silver Tabs (Multiple vitamins-minerals) 6)  Tylenol Extra Strength 500 Mg Tabs (Acetaminophen) .... As needed 7)  Tramadol Hcl 50 Mg Tabs (Tramadol hcl) .... One to two by mouth every 4-6 hours 8)  Prednisone 10 Mg Tabs (Prednisone) .... 4 each am x 2days, 2x2days, 1x2days and stop 9)  Avelox 400 Mg Tabs (Moxifloxacin hcl) .... By mouth daily   Patient Instructions: 1)  Please schedule a follow-up appointment in 2 weeks. 2)  I emphasized that nasal steroids have no immediate benefit in terms of improving symptoms.  To help them reached the target tissue, the patient should use Afrin two puffs every 12 hours applied one min before using the nasal steroids.  Afrin should be stopped after no more than 5 days.  If the symptoms worsen, Afrin can be restarted after 5 days off of therapy to prevent rebound congestion from overuse of Afrin.  I also emphasized that in no way are nasal steroids a concern in terms of "addiction". 3)  GERD (gastroesophageal reflux disease) was discussed. It is a common cause of respiratory symptoms. It commonly presents in the absence of heartburn. GERD can be treated with medication, but also with lifestyle changes including avoidance of late meals, excessive alcohol, smoking cessation,  and avoid fatty foods, chocolate, peppermint, colas, red wine, and acidic juices such as orange juice. NO MINT OR MENTHOL PRODUCTS 4)  USE SUGARLESS CANDY INSTEAD (jolley ranchers)  5)      Prescriptions: REGLAN 10 MG  TABS (METOCLOPRAMIDE HCL) By mouth before each meal and at bedtime.  #60 x 2   Entered and Authorized by:   Nyoka Cowden MD   Signed by:   Nyoka Cowden MD on 03/31/2008   Method used:   Electronically sent to ...       [id number]       [address]       [place], [place]  [address]       Ph: [phone] or [phone]  Fax: [phone]   RxID:   [id number] TRAMADOL HCL 50 MG  TABS (TRAMADOL HCL) One to two by mouth every 4-6 hours  #40 x 0   Entered and Authorized by:   Nyoka Cowden MD   Signed by:   Nyoka Cowden MD on 03/31/2008   Method used:   Electronically sent to ...       [facility] [address]  [id number]       [address]       [place], [place]  [address]       Ph: [phone] or [phone]       Fax: [phone]   RxID:   [id number] OMEPRAZOLE 20 MG  CPDR (OMEPRAZOLE) Take one 30-60 min before first and last meals of the day  #60 x 2   Entered and Authorized by:   Nyoka Cowden MD   Signed by:   Nyoka Cowden MD on 03/31/2008   Method used:   Electronically sent to ...       [facility] [address]  [id number]       [address]       [place], [place]  [address]       Ph: [phone] or [phone]       Fax: [phone]   RxID:   [id number]  ]  Appended Document: persistant cough/ sob////kp chest x-rays suggest a right middle lobe process that may reflect a form of acquired mucocele or dysfunction as a cause of the cough.  She will need a follow-up chest x-ray on her return visit and perhaps a workup for this but do not believe allergy testing  is necessary at this point and have asked her to hold off on this for now

## 2011-03-04 NOTE — Op Note (Signed)
NAMESADIA, [name]                 ACCOUNT NO.:  [id number]   MEDICAL RECORD NO.:  [id number]          PATIENT TYPE:  OIB   LOCATION:  1621                         FACILITY:  [facility]   PHYSICIAN:  Sharlet Salina T. Hoxworth, M.D.DATE OF BIRTH:  04-05-1956   DATE OF PROCEDURE:  07/26/2007  DATE OF DISCHARGE:                               OPERATIVE REPORT   PREOPERATIVE DIAGNOSIS:  Morbid obesity.   POSTOPERATIVE DIAGNOSIS:  Morbid obesity.   SURGICAL PROCEDURES:  Placement of laparoscopic adjustable gastric band.   SURGEON:  Lorne Skeens. Hoxworth, M.D.   ASSISTANT:  Thornton Park. Daphine Deutscher, MD   ANESTHESIA:  General.   BRIEF HISTORY:  Kayla [name] is a 55 year old female with a history of  progressive morbid obesity, unresponsive to medical management.  After  extensive discussion and workup detailed elsewhere, we have elected to  proceed with placement of laparoscopic adjustable gastric band for  treatment of her morbid obesity.  She is now brought to the operating  room for this procedure.   DESCRIPTION OF OPERATION:  The patient brought to the operating room and  placed in the supine position on the operating room table; general  endotracheal anesthesia was induced.  PAS were in place.  She received  preoperative IV antibiotics and subcutaneous heparin.  The abdomen was  widely sterilely prepped and draped.  Correct patient and procedure were  verified.  Local anesthesia was used to infiltrate the trocar sites.  Through a 1 cm left subcostal incision, abdominal access was obtained  with 11 mm OptiVu trocar without difficulty; and pneumoperitoneum was  established.  Under direct vision, a 15 mm trocar was placed to the  right xiphoid through the falciform ligament; and an 11 mm trocar in the  right upper quadrant, with another 11 mm trocar just to the left of the  midline for the camera port.  Through a 5 mm subxiphoid incision, the  Nathanson  retractor was placed and the left lobe of  liver elevated with  excellent closure of the hiatus and upper stomach.  Finally, a 5 mm  trocar was placed in the left flank.  The abdomen was inspected for any  evidence of trocar injury; none was seen.  The hiatus and angle of HIS  were exposed, and the peritoneum overlying the left crus was incised.  Using the finger dissector, blunt dissection was carried down along the  left crus toward the retrogastric space.  Following this, the  gastrohepatic omentum was divided in an avascular area, and  retroperitoneal fat was retracted medially -- exposing the base of the  right crus at the area of crossing fat.  This area of the peritoneum was  incised and blunt dissection just anterior to the base of the right crus  was used to develop a retrogastric tunnel using the finger dissector.  This was then deployed up through the previously dissected area at the  angle of His without any difficulty.   Following this, a prefilled APS band system was introduced into the  abdomen and the tubing placed through the finger retractor; which was  then brought back through the retrogastric tunnel.  The band was then  brought back through this tunnel without difficulty.  The sizing tube  was introduced into the stomach and then the tubing was placed through  the buckle and the band buckled into place without any undue tension.  The sizing tube was removed.   The fundus was then imbricated up over the band to the small gastric  pouch with 3 interrupted 2-0 Ethibond sutures.  The band appeared to be  in excellent position.  The tubing was brought out through the right mid  abdominal trocar site and the [facility] retractor removed.  All CO2 was  evacuated and the trocars removed.  The right mid-abdominal incision was  lengthened slightly, and the anterior fascia cleared for placement of  the port.  Four 2-0 Prolene sutures were placed in the corresponding  areas of the anterior fascia.  The tubing was trimmed  and attached the  port; which was then sutured to the anterior chest abdominal wall with  the previously placed sutures.  The tubing was seen to curve smoothly  into the abdomen.  The subcutaneous at this site was closed with a  running 3-0 Vicryl.  Skin incisions were closed with interrupted 4-0  Monocryl and Dermabond.   Sponge, instrument and needle counts were correct.  The patient was  taken to the recovery room in good condition.      Lorne Skeens. Hoxworth, M.D.  Electronically Signed     BTH/MEDQ  D:  07/26/2007  T:  07/26/2007  Job:  [id number]

## 2011-03-04 NOTE — Op Note (Signed)
NAMECHRISTINA, Kayla [name]                 ACCOUNT NO.:  [id number]   MEDICAL RECORD NO.:  [id number]          PATIENT TYPE:  AMB   LOCATION:  ENDO                         FACILITY:  [facility]   PHYSICIAN:  Anselmo Rod, M.D.  DATE OF BIRTH:  1956-07-08   DATE OF PROCEDURE:  09/20/2008  DATE OF DISCHARGE:                               OPERATIVE REPORT   PROCEDURE PERFORMED:  Screening colonoscopy.   ENDOSCOPIST:  Anselmo Rod, M.D.   INSTRUMENT USED:  Pentax video colonoscope.   INDICATIONS FOR PROCEDURE:  A 55 year old female undergoing a screening  colonoscopy to rule out colonic polyps, masses, etc.   PREPROCEDURE PREPARATION:  Informed consent was procured from the  patient.  The patient fasted for 8 hours prior to the procedure and  prepped with a bottle of magnesium citrate and gallon of NuLYTELY the  night prior to the procedure.  Risks and benefits of the procedure  including a 10% miss rate of cancer and polyps were discussed with the  patient as well.   PREPROCEDURE PHYSICAL:  The patient had stable vital signs.  NECK:  Supple.  LUNGS:  Clear to auscultation.  S1-S2 regular.  ABDOMEN:  Soft with normal bowel sounds.   DESCRIPTION OF PROCEDURE:  The patient was placed in the left lateral  decubitus position and sedated with 100 mcg of fentanyl and 10 mg of  Versed given intravenously in slow incremental doses.  Once the patient  was adequately sedated and maintained on low-flow oxygen and continuous  cardiac monitoring, the Pentax video colonoscope was advanced from the  rectum to the cecum.  The appendiceal orifice and cecal valve were  clearly visualized and photographed.  The terminal ileum appeared  healthy without lesions.  No masses, polyps, erosions, ulcerations or  diverticula were seen.  Retroflexion in the rectum revealed no  abnormalities.   IMPRESSION:  Normal colonoscopy to the terminal ileum.  No masses,  polyps, erosions, ulcerations, or diverticula.  Normal rectum.   RECOMMENDATIONS:  1. Continue high-fiber diet with liberal fluid intake.  2. Repeat colonoscopy in the next 10 years.  3. If the patient has any abnormal symptoms in the interim of from a      GI standpoint, she is to contact the office immediately for further      recommendations.      Anselmo Rod, M.D.  Electronically Signed     JNM/MEDQ  D:  09/21/2008  T:  09/22/2008  Job:  [id number]   cc:   Gretta Cool, M.D.  Fax: [phone]

## 2011-03-07 NOTE — Op Note (Signed)
Kayla [name], Kayla [name]                 ACCOUNT NO.:  [id number]   MEDICAL RECORD NO.:  [id number]          PATIENT TYPE:  AMB   LOCATION:  [facility]                         FACILITY:  [facility]   PHYSICIAN:  Jene Every, M.D.    DATE OF BIRTH:  1956/03/22   DATE OF PROCEDURE:  01/01/2007  DATE OF DISCHARGE:                               OPERATIVE REPORT   PREOPERATIVE DIAGNOSIS:  Medial meniscus tear and degenerative joint  disease, right knee.   POSTOPERATIVE DIAGNOSIS:  Medial meniscus tear and degenerative joint  disease, right knee, grade 2 chondromalacia of the patella, medial  femoral condyle, medial tibial plateau.   PROCEDURE PERFORMED:  Right knee arthroscopy, partial medial  meniscectomy, chondroplasty of the medial femoral condyle, tibial  plateau, and patella.   BRIEF HISTORY AND INDICATIONS:  55 year old with knee pain refractory to  conservative treatment.  MRI shows meniscus tear, degenerative changes,  operative intervention is indicated for diagnosis and treatment.  The  risks and benefits were discussed including bleeding, infection, damage  to neurovascular structures, no change in symptoms, worsening of  symptoms, need for repeat debridement in the future.   SURGICAL TECHNIQUE:  With the patient in the supine position, after the  satisfactory induction of general anesthesia and 2 grams Kefzol and 500  mg vancomycin, the right lower extremity was prepped and draped in the  usual sterile fashion.  A lateral parapatellar portal and superomedial  parapatellar portal was fashioned with a #11 blade.  Ingress cannula  atraumatically placed.  Irrigant was utilized to insufflate the joint.  The patient was fairly obese, we used a lower medial parapatellar portal  due to the size of the thigh and the thigh holder.  Under direct  visualization, after insertion of the camera in the lateral portal, I  fashioned a medial parapatellar portal after localization of an 18 gauge  needle  sparing the medial meniscus.  Noted was extensive grade 3 changes  of the patella, normal patellofemoral tracking, grade 3 changes of the  medial femoral condyle and tibial plateau.  The posterior third of the  medial meniscus was torn and unstable.  I performed chondroplasty of the  patella, medial femoral condyle and medial tibial plateau with a 42 CUDA  shaver to a stable base.  Almost grade 4 changes of the medial tibial  plateau.  Straight basket rongeurs utilized through the medial  parapatellar portal to perform a partial medial meniscectomy to a stable  base.  The ACL and PCL were unremarkable.  The lateral compartment was  normal with normal femoral condyle, tibial plateau, and meniscus, stable  to palpation.  I probed the entire surface of the femoral condyle and  tibia without pathology.  I re-visited the medial compartment, again,  there were grade 3 changes noted throughout.  The remainder of the  meniscus was stable to probe palpation.  I copiously lavaged the knee,  again examined all compartments including the gutters, no residual  pathology amenable to arthroscopic intervention.  All instrumentation  was removed.  The portals were closed with 4-0 nylon simple suture.  0.25%  Marcaine with  epinephrine was infiltrated in the joint.  The wound was dressed  sterilely.  She was awakened without difficulty and transported to the  recovery room in satisfactory condition.  The patient tolerated the  procedure well without complications.      Jene Every, M.D.  Electronically Signed     JB/MEDQ  D:  01/01/2007  T:  01/02/2007  Job:  [id number]

## 2011-06-25 ENCOUNTER — Encounter: Payer: Self-pay | Admitting: Internal Medicine

## 2011-06-27 ENCOUNTER — Encounter: Payer: Self-pay | Admitting: Internal Medicine

## 2011-06-27 ENCOUNTER — Ambulatory Visit (INDEPENDENT_AMBULATORY_CARE_PROVIDER_SITE_OTHER): Payer: PRIVATE HEALTH INSURANCE | Admitting: Internal Medicine

## 2011-06-27 VITALS — BP 120/70 | HR 82 | Temp 98.4°F | Resp 20 | Ht 68.0 in | Wt 306.0 lb

## 2011-06-27 DIAGNOSIS — Z0000 Encounter for general adult medical examination without abnormal findings: Secondary | ICD-10-CM

## 2011-06-27 DIAGNOSIS — M1990 Unspecified osteoarthritis, unspecified site: Secondary | ICD-10-CM

## 2011-06-27 NOTE — Progress Notes (Signed)
Subjective:    Patient ID: Kayla [name], female    DOB: May 28, 1956, 55 y.o.   MRN: [id number]  HPI  55 year old patient who is seen today for a preoperative evaluation. She has a history of exogenous obesity. She is now worsening the right knee pain due to end-stage osteoarthritis. She has a history of gastroesophageal reflux disease controlled with OTC H2 blockers. She has no new concerns or complaints she has no restrictions other than limitations from her right knee pain she takes no chronic medications and rare furosemide for peripheral edema.  Alcohol-Tobacco  Smoking Status: never   Allergies:  1) ! Pcn  2) ! Tetracycline  3) ! Morphine  4) ! Ceclor   Past History:  Past Medical History:   Allergies seasonal  exogenous obesity  history of type 2 diabetes   Past Surgical History:  Lap Band surgery October 2008  Hysterectomy- completes 1993  Cholecystectomy- 1995  Right  knee arthroscopic surgery  Colonoscopy in November 2009  Family History:  Reviewed history from 03/31/2008 and no changes required.  Paternal grandfather had emphesema  Father has and "black lung"  Paternal grandmother had asthma  mother died in age 51, complications of senile dementia  one brother two sisters  one sister had advanced mental retardation and died of complications of cerebral vascular disease   Social History:  Reviewed history from 03/31/2008 and no changes required.  Works as a Print production planner  Married with one child  Never smoked  Alcohol on occ.Smoking Status: never    Review of Systems  Constitutional: Negative.   HENT: Negative for hearing loss, congestion, sore throat, rhinorrhea, dental problem, sinus pressure and tinnitus.   Eyes: Negative for pain, discharge and visual disturbance.  Respiratory: Negative for cough and shortness of breath.   Cardiovascular: Negative for chest pain, palpitations and leg swelling.  Gastrointestinal: Negative for nausea, vomiting,  abdominal pain, diarrhea, constipation, blood in stool and abdominal distention.  Genitourinary: Negative for dysuria, urgency, frequency, hematuria, flank pain, vaginal bleeding, vaginal discharge, difficulty urinating, vaginal pain and pelvic pain.  Musculoskeletal: Positive for joint swelling and gait problem. Negative for arthralgias.  Skin: Negative for rash.  Neurological: Negative for dizziness, syncope, speech difficulty, weakness, numbness and headaches.  Hematological: Negative for adenopathy.  Psychiatric/Behavioral: Negative for behavioral problems, dysphoric mood and agitation. The patient is not nervous/anxious.        Objective:   Physical Exam  Constitutional: She is oriented to person, place, and time. She appears well-developed and well-nourished. No distress.       Weight 306 blood pressure 120/78   HENT:  Head: Normocephalic.  Right Ear: External ear normal.  Left Ear: External ear normal.  Mouth/Throat: Oropharynx is clear and moist.  Eyes: Conjunctivae and EOM are normal. Pupils are equal, round, and reactive to light.  Neck: Normal range of motion. Neck supple. No thyromegaly present.  Cardiovascular: Normal rate, regular rhythm, normal heart sounds and intact distal pulses.   Pulmonary/Chest: Effort normal and breath sounds normal.  Abdominal: Soft. Bowel sounds are normal. She exhibits no mass. There is no tenderness.  Musculoskeletal: Normal range of motion.  Lymphadenopathy:    She has no cervical adenopathy.  Neurological: She is alert and oriented to person, place, and time.  Skin: Skin is warm and dry. No rash noted.  Psychiatric: She has a normal mood and affect. Her behavior is normal.          Assessment & Plan:  End-stage right knee osteoarthritis Exogenous obesity  Medically stable for surgery. Forms completed and faxed

## 2011-06-27 NOTE — Patient Instructions (Signed)
Limit your sodium (Salt) intake    It is important that you exercise regularly, at least 20 minutes 3 to 4 times per week.  If you develop chest pain or shortness of breath seek  medical attention.  You need to lose weight.  Consider a lower calorie diet and regular exercise. 

## 2011-07-21 HISTORY — PX: TOTAL KNEE ARTHROPLASTY: SHX125

## 2011-07-31 LAB — URINALYSIS, ROUTINE W REFLEX MICROSCOPIC
Bilirubin Urine: NEGATIVE
Glucose, UA: NEGATIVE
Hgb urine dipstick: NEGATIVE
Ketones, ur: NEGATIVE
Nitrite: NEGATIVE
Protein, ur: NEGATIVE
Specific Gravity, Urine: 1.017
Urobilinogen, UA: 0.2
pH: 7

## 2011-07-31 LAB — BASIC METABOLIC PANEL
BUN: 22
CO2: 26
Calcium: 10.2
Chloride: 103
Creatinine, Ser: 0.86
GFR calc Af Amer: >60
GFR calc non Af Amer: >60
Glucose, Bld: 99
Potassium: 3.8
Sodium: 141

## 2011-07-31 LAB — DIFFERENTIAL
Basophils Absolute: 0.1
Basophils Relative: 1
Eosinophils Absolute: 0
Eosinophils Relative: 0
Lymphocytes Relative: 16
Lymphs Abs: 1.2
Monocytes Absolute: 0.6
Monocytes Relative: 9
Neutro Abs: 5.7
Neutrophils Relative %: 75

## 2011-07-31 LAB — CBC
HCT: 35.8 — ABNORMAL LOW
Hemoglobin: 12.7
MCHC: 35.3
MCV: 88.4
Platelets: 300
RBC: 4.05
RDW: 14.7 — ABNORMAL HIGH
WBC: 7.6

## 2011-07-31 LAB — HEMOGLOBIN AND HEMATOCRIT, BLOOD
HCT: 39.3
Hemoglobin: 13.8

## 2011-08-04 ENCOUNTER — Other Ambulatory Visit: Payer: Self-pay | Admitting: Specialist

## 2011-08-04 ENCOUNTER — Encounter (HOSPITAL_COMMUNITY): Payer: PRIVATE HEALTH INSURANCE

## 2011-08-04 ENCOUNTER — Ambulatory Visit (HOSPITAL_COMMUNITY)
Admission: RE | Admit: 2011-08-04 | Discharge: 2011-08-04 | Disposition: A | Discharge status: Home / Self Care | Payer: PRIVATE HEALTH INSURANCE | Source: Ambulatory Visit | Attending: Specialist | Admitting: Specialist

## 2011-08-04 ENCOUNTER — Other Ambulatory Visit (HOSPITAL_COMMUNITY): Payer: Self-pay | Admitting: Specialist

## 2011-08-04 DIAGNOSIS — I4510 Unspecified right bundle-branch block: Secondary | ICD-10-CM | POA: Insufficient documentation

## 2011-08-04 DIAGNOSIS — Z01818 Encounter for other preprocedural examination: Secondary | ICD-10-CM

## 2011-08-04 DIAGNOSIS — Z01810 Encounter for preprocedural cardiovascular examination: Secondary | ICD-10-CM | POA: Insufficient documentation

## 2011-08-04 DIAGNOSIS — Z01812 Encounter for preprocedural laboratory examination: Secondary | ICD-10-CM | POA: Insufficient documentation

## 2011-08-04 LAB — CBC
HCT: 40.1 % (ref 36.0–46.0)
Hemoglobin: 13.1 g/dL (ref 12.0–15.0)
MCH: 29 pg (ref 26.0–34.0)
MCHC: 32.7 g/dL (ref 30.0–36.0)
MCV: 88.9 fL (ref 78.0–100.0)
Platelets: 325 10*3/uL (ref 150–400)
RBC: 4.51 MIL/uL (ref 3.87–5.11)
RDW: 13.9 % (ref 11.5–15.5)
WBC: 8.3 10*3/uL (ref 4.0–10.5)

## 2011-08-04 LAB — URINALYSIS, ROUTINE W REFLEX MICROSCOPIC
Bilirubin Urine: NEGATIVE
Glucose, UA: NEGATIVE mg/dL
Hgb urine dipstick: NEGATIVE
Ketones, ur: NEGATIVE mg/dL
Leukocytes, UA: NEGATIVE
Nitrite: NEGATIVE
Protein, ur: NEGATIVE mg/dL
Specific Gravity, Urine: 1.024 (ref 1.005–1.030)
Urobilinogen, UA: 0.2 mg/dL (ref 0.0–1.0)
pH: 6 (ref 5.0–8.0)

## 2011-08-04 LAB — COMPREHENSIVE METABOLIC PANEL
ALT: 19 U/L (ref 0–35)
AST: 18 U/L (ref 0–37)
Albumin: 3.5 g/dL (ref 3.5–5.2)
Alkaline Phosphatase: 53 U/L (ref 39–117)
BUN: 17 mg/dL (ref 6–23)
CO2: 26 mEq/L (ref 19–32)
Calcium: 9.5 mg/dL (ref 8.4–10.5)
Chloride: 102 mEq/L (ref 96–112)
Creatinine, Ser: 0.73 mg/dL (ref 0.50–1.10)
GFR calc Af Amer: >90 mL/min (ref >90)
GFR calc non Af Amer: >90 mL/min (ref >90)
Glucose, Bld: 84 mg/dL (ref 70–99)
Potassium: 3.8 mEq/L (ref 3.5–5.1)
Sodium: 137 mEq/L (ref 135–145)
Total Bilirubin: 0.9 mg/dL (ref 0.3–1.2)
Total Protein: 7.2 g/dL (ref 6.0–8.3)

## 2011-08-04 LAB — DIFFERENTIAL
Basophils Absolute: 0 10*3/uL (ref 0.0–0.1)
Basophils Relative: 1 % (ref 0–1)
Eosinophils Absolute: 0.2 10*3/uL (ref 0.0–0.7)
Eosinophils Relative: 2 % (ref 0–5)
Lymphocytes Relative: 21 % (ref 12–46)
Lymphs Abs: 1.8 10*3/uL (ref 0.7–4.0)
Monocytes Absolute: 0.7 10*3/uL (ref 0.1–1.0)
Monocytes Relative: 8 % (ref 3–12)
Neutro Abs: 5.7 10*3/uL (ref 1.7–7.7)
Neutrophils Relative %: 68 % (ref 43–77)

## 2011-08-04 LAB — PROTIME-INR
INR: 1.02 (ref 0.00–1.49)
Prothrombin Time: 13.6 seconds (ref 11.6–15.2)

## 2011-08-04 LAB — SURGICAL PCR SCREEN
MRSA, PCR: NEGATIVE
Staphylococcus aureus: POSITIVE — AB

## 2011-08-04 LAB — APTT: aPTT: 34 seconds (ref 24–37)

## 2011-08-04 LAB — ABO/RH: ABO/RH(D): O POS

## 2011-08-07 ENCOUNTER — Inpatient Hospital Stay (HOSPITAL_COMMUNITY)
Admission: RE | Admit: 2011-08-07 | Discharge: 2011-08-11 | DRG: 470 | Disposition: A | Discharge status: Home Health | Payer: PRIVATE HEALTH INSURANCE | Source: Ambulatory Visit | Attending: Specialist | Admitting: Specialist

## 2011-08-07 DIAGNOSIS — R110 Nausea: Secondary | ICD-10-CM | POA: Diagnosis not present

## 2011-08-07 DIAGNOSIS — Z9884 Bariatric surgery status: Secondary | ICD-10-CM

## 2011-08-07 DIAGNOSIS — M1710 Unilateral primary osteoarthritis, unspecified knee: Principal | ICD-10-CM | POA: Diagnosis present

## 2011-08-07 DIAGNOSIS — M810 Age-related osteoporosis without current pathological fracture: Secondary | ICD-10-CM | POA: Diagnosis present

## 2011-08-07 DIAGNOSIS — L299 Pruritus, unspecified: Secondary | ICD-10-CM | POA: Diagnosis not present

## 2011-08-07 DIAGNOSIS — R269 Unspecified abnormalities of gait and mobility: Secondary | ICD-10-CM | POA: Diagnosis present

## 2011-08-07 DIAGNOSIS — J309 Allergic rhinitis, unspecified: Secondary | ICD-10-CM | POA: Diagnosis present

## 2011-08-07 DIAGNOSIS — Z01812 Encounter for preprocedural laboratory examination: Secondary | ICD-10-CM

## 2011-08-07 DIAGNOSIS — Z885 Allergy status to narcotic agent status: Secondary | ICD-10-CM

## 2011-08-07 DIAGNOSIS — M21169 Varus deformity, not elsewhere classified, unspecified knee: Secondary | ICD-10-CM | POA: Diagnosis present

## 2011-08-07 DIAGNOSIS — Z880 Allergy status to penicillin: Secondary | ICD-10-CM

## 2011-08-07 DIAGNOSIS — K219 Gastro-esophageal reflux disease without esophagitis: Secondary | ICD-10-CM | POA: Diagnosis present

## 2011-08-07 LAB — TYPE AND SCREEN
ABO/RH(D): O POS
Antibody Screen: NEGATIVE

## 2011-08-08 LAB — BASIC METABOLIC PANEL
BUN: 7 mg/dL (ref 6–23)
CO2: 25 mEq/L (ref 19–32)
Calcium: 8.6 mg/dL (ref 8.4–10.5)
Chloride: 103 mEq/L (ref 96–112)
Creatinine, Ser: 0.64 mg/dL (ref 0.50–1.10)
GFR calc Af Amer: >90 mL/min (ref >90)
GFR calc non Af Amer: >90 mL/min (ref >90)
Glucose, Bld: 127 mg/dL — ABNORMAL HIGH (ref 70–99)
Potassium: 3.7 mEq/L (ref 3.5–5.1)
Sodium: 134 mEq/L — ABNORMAL LOW (ref 135–145)

## 2011-08-08 LAB — CBC
HCT: 32.8 % — ABNORMAL LOW (ref 36.0–46.0)
Hemoglobin: 11.1 g/dL — ABNORMAL LOW (ref 12.0–15.0)
MCH: 29.9 pg (ref 26.0–34.0)
MCHC: 33.8 g/dL (ref 30.0–36.0)
MCV: 88.4 fL (ref 78.0–100.0)
Platelets: 256 10*3/uL (ref 150–400)
RBC: 3.71 MIL/uL — ABNORMAL LOW (ref 3.87–5.11)
RDW: 14.1 % (ref 11.5–15.5)
WBC: 7.1 10*3/uL (ref 4.0–10.5)

## 2011-08-08 NOTE — Op Note (Signed)
NAMEMELENIE, Kayla [name]                 ACCOUNT NO.:  [id number]  MEDICAL RECORD NO.:  [id number]  LOCATION:                               FACILITY:  [facility]  PHYSICIAN:  Jene Every, M.D.    DATE OF BIRTH:  31-Mar-1956  DATE OF PROCEDURE: DATE OF DISCHARGE:                              OPERATIVE REPORT   PREOPERATIVE DIAGNOSES:  Degenerative joint disease with end-stage osteoporosis, bone-on-bone, medial compartment of the right knee.  POSTOPERATIVE DIAGNOSES:  Degenerative joint disease with end-stage osteoporosis, bone-on-bone, medial compartment of the right knee.  PROCEDURE PERFORMED:  Right total knee arthroplasty utilizing DePuy rotating platform, 4 femur, 4 tibia, 10 mm insert, 38 patella.  Great difficulty, increased with the patient's morbid obesity,  the patient was 208 pounds, 6 feet tall.  ANESTHESIA:  General.  ASSISTANT:  Roma Schanz, P.A. utilized for necessary surgical exposure, holding, closure.  HISTORY:  54, end-stage bone-on-bone osteoarthritis of the right knee varus deformity indicated for replacement of the degenerated joint, failing conservative treatment.  Risk and benefits were discussed including bleeding, infection, damage to neurovascular structures, DVT, PE, anesthetic complications, need for revision etc.  TECHNIQUE:  The patient was placed in supine position.  After induction of adequate general anesthesia and 1 g vancomycin with history of allergy to ampicillin, the right lower extremity was prepped and draped in the usual sterile fashion.  Thigh tourniquet was inflated to 350 mmHg after exsanguination.  Through a neutral extension, made a midline incision based on the patella and full-thickness flaps median and parapatellar arthrotomy were performed.  Patella everted, knee was flexed after soft tissue was elevated medially preserving the medial collateral ligament.  Tricompartmental osteoporosis was noted.  Step drill was utilized to  enter the femoral canal, this was irrigated.  T- Handle reamer was then placed without difficulty 5 degree right or heading into the distal femur 10 mm off the cut.  This was pinned, oscillating saw performed the cut.  Soft tissues were protected.  This was then utilized to size the femur, referenced off the anterior cortex to a 4, pinned at 3 degrees of external rotation colinear with the epicondylar axis.  Anterior and posterior chamfer cuts were then performed with the oscillating saw protecting the soft tissues at all times.  Next, attention was turned towards the tibia, subluxed. McCullough retractor was utilized, remnants of the medial lateral menisci and ACL were removed.  Geniculate was cauterized.  The external alignment guide, parallel to the tibia bisecting the ankle, 10 off the high side, which was lateral, was utilized, this was then pinned.  An oscillating saw performed the proximal cut.  Extreme difficulty throughout the case due to the patient's morbid obesity.  Next, we trialed our flexion extension gaps and they were equivalent.  We then turned our attention back towards completion, the tibia was subluxed, trialed, measured to a 4, just medial aspect of the tibial tubercle.  We then used our central drill and punch guide and temporary insert. Attention was turned back towards the femur.  Performed the box cut bisecting the femoral canal and the condyles.  The box cut was then performed with an oscillating saw, rasp  utilized, trial femur placed. Trial 10 inserted, reduced, full extension, full flexion, good stability, varus valgus stressing 0-30 degrees.  Patella everted, measured between 38-25 thickness, this was planed to a 16 mm base with a guide.  These were then drilled, a trial patella placed, reduced the patella.  Patellofemoral tracking was normal throughout flexion, extension.  Therefore, all trials were then removed.  We checked posteriorly, osteophytes and any  loose bodies were removed.  It was copiously irrigated with low-pressure pulsatile lavage.  The knee was then flexed.  Cement was then mixed on the back table in the appropriate fashion and after thoroughly drying the surfaces the cement was injected and digitally pressurized into the tibial canal on to the tibial plateau.  The 4 permanent component was then impacted in place with redundant cement removed.  The femur was cemented again with redundant cement removed.  A small defect in the lateral condyle measuring 1 x 1 cm, that was backfilled with cement.  Trial 10 was then placed, reduced to an axial load applied throughout the curing of the cement, redundant cement removed.  The patella was cemented and clamped, redundant cement removed.  Following this, trial revealed the 10 was the optimal insert with good full extension and flexion, and patellofemoral tracking was therefore removed.  The joint was inspected and meticulously cleaned of all redundant cement, copiously irrigated once again.  A permanent 10 was then placed, reduced, full extension, full flexion, good stability, varus valgus stressing at 0 to 30 degrees and normal patellofemoral tracking.  We placed a Hemovac and brought it out through a lateral stab wound in the skin.  With the knee in slight flexion, we closed the patellar arthrotomy with #1 Vicryl interrupted figure-of-8 sutures, subcu with 0 and 2-0 Vicryl simple sutures.  The skin was reapproximated with staples.  The wound was dressed sterilely.  She had flexion at 90 degrees against gravity.  The tourniquet was deflated and there was adequate revascularization of the lower extremity appreciated.  The patient tolerated the procedure well.  No complications.  Minimal blood loss.  Tourniquet time was 1 hour and 5 minutes.     Jene Every, M.D.     Cordelia Pen  D:  08/07/2011  T:  08/07/2011  Job:  [id number]  Electronically Signed by Jene Every M.D. on  08/08/2011 09:14:17 AM

## 2011-08-09 LAB — BASIC METABOLIC PANEL
BUN: 8 mg/dL (ref 6–23)
CO2: 23 mEq/L (ref 19–32)
Calcium: 9.1 mg/dL (ref 8.4–10.5)
Chloride: 104 mEq/L (ref 96–112)
Creatinine, Ser: 0.63 mg/dL (ref 0.50–1.10)
GFR calc Af Amer: >90 mL/min (ref >90)
GFR calc non Af Amer: >90 mL/min (ref >90)
Glucose, Bld: 102 mg/dL — ABNORMAL HIGH (ref 70–99)
Potassium: 3.6 mEq/L (ref 3.5–5.1)
Sodium: 136 mEq/L (ref 135–145)

## 2011-08-09 LAB — CBC
HCT: 35.9 % — ABNORMAL LOW (ref 36.0–46.0)
Hemoglobin: 11.7 g/dL — ABNORMAL LOW (ref 12.0–15.0)
MCH: 29.3 pg (ref 26.0–34.0)
MCHC: 32.6 g/dL (ref 30.0–36.0)
MCV: 90 fL (ref 78.0–100.0)
Platelets: 265 10*3/uL (ref 150–400)
RBC: 3.99 MIL/uL (ref 3.87–5.11)
RDW: 14.1 % (ref 11.5–15.5)
WBC: 8.6 10*3/uL (ref 4.0–10.5)

## 2011-08-10 LAB — BASIC METABOLIC PANEL
BUN: 7 mg/dL (ref 6–23)
CO2: 27 mEq/L (ref 19–32)
Calcium: 9.2 mg/dL (ref 8.4–10.5)
Chloride: 103 mEq/L (ref 96–112)
Creatinine, Ser: 0.63 mg/dL (ref 0.50–1.10)
GFR calc Af Amer: >90 mL/min (ref >90)
GFR calc non Af Amer: >90 mL/min (ref >90)
Glucose, Bld: 107 mg/dL — ABNORMAL HIGH (ref 70–99)
Potassium: 3.5 mEq/L (ref 3.5–5.1)
Sodium: 136 mEq/L (ref 135–145)

## 2011-08-10 LAB — CBC
HCT: 32.3 % — ABNORMAL LOW (ref 36.0–46.0)
Hemoglobin: 10.9 g/dL — ABNORMAL LOW (ref 12.0–15.0)
MCH: 29.8 pg (ref 26.0–34.0)
MCHC: 33.7 g/dL (ref 30.0–36.0)
MCV: 88.3 fL (ref 78.0–100.0)
Platelets: 232 10*3/uL (ref 150–400)
RBC: 3.66 MIL/uL — ABNORMAL LOW (ref 3.87–5.11)
RDW: 14.3 % (ref 11.5–15.5)
WBC: 6.3 10*3/uL (ref 4.0–10.5)

## 2011-08-11 LAB — BASIC METABOLIC PANEL
BUN: 7 mg/dL (ref 6–23)
CO2: 28 mEq/L (ref 19–32)
Calcium: 9 mg/dL (ref 8.4–10.5)
Chloride: 102 mEq/L (ref 96–112)
Creatinine, Ser: 0.67 mg/dL (ref 0.50–1.10)
GFR calc Af Amer: >90 mL/min (ref >90)
GFR calc non Af Amer: >90 mL/min (ref >90)
Glucose, Bld: 100 mg/dL — ABNORMAL HIGH (ref 70–99)
Potassium: 3.3 mEq/L — ABNORMAL LOW (ref 3.5–5.1)
Sodium: 137 mEq/L (ref 135–145)

## 2011-08-11 LAB — CBC
HCT: 32.6 % — ABNORMAL LOW (ref 36.0–46.0)
Hemoglobin: 10.5 g/dL — ABNORMAL LOW (ref 12.0–15.0)
MCH: 29 pg (ref 26.0–34.0)
MCHC: 32.2 g/dL (ref 30.0–36.0)
MCV: 90.1 fL (ref 78.0–100.0)
Platelets: 248 10*3/uL (ref 150–400)
RBC: 3.62 MIL/uL — ABNORMAL LOW (ref 3.87–5.11)
RDW: 14.4 % (ref 11.5–15.5)
WBC: 5 10*3/uL (ref 4.0–10.5)

## 2011-08-12 NOTE — H&P (Signed)
Kayla [name], Kayla [name]               ACCOUNT NO.:  [id number]  MEDICAL RECORD NO.:  [id number]  LOCATION:                               FACILITY:  [facility]  PHYSICIAN:  Jene Every, M.D.    DATE OF BIRTH:  03-11-1956  DATE OF ADMISSION:  08/07/2011 DATE OF DISCHARGE:                             HISTORY & PHYSICAL   CHIEF COMPLAINT:  Right knee pain.  HISTORY:  Kayla [name] is a pleasant 55 year old female with a longstanding history of knee pain.  She has undergone conservative treatment including cortisone injections, started on without significant relief. Radiographs of her knee do reveal bone-on-bone osteoarthritis along the medial compartment as well as patellofemoral arthrosis noted as well. The patient describes her pain as disabling, so at this time she would benefit from a total knee arthroplasty.  The risks and benefits of the surgery were discussed with the patient.  She does wish to proceed.  PAST MEDICAL HISTORY:  Significant for seasonal allergies and GERD.  CURRENT MEDICATIONS:  Include: 1. Zantac. 2. Allegra p.r.n. 3. Multivitamin. 4. Allergy shots.  ALLERGIES:  Include PENICILLIN, which causes hives.  CECLOR, TETRACYCLINE, ASPIRIN.  MORPHINE, which causes nausea and vomiting.  PREVIOUS SURGERIES:  Includes cholecystectomy, right knee arthroscopy, hysterectomy, lap-band surgery.  SOCIAL HISTORY:  The patient is married.  She and her husband works for a [employer].  She has never smoked.  She denies any alcohol consumption.  Husband and son will be caregivers following surgery.  PRIMARY CARE PHYSICIAN:  Gordy Savers, MD  FAMILY HISTORY:  Mother deceased at age 12, she had a history of Alzheimer's and father deceased at 53.  She has a sister that has had a CVA.  REVIEW OF SYSTEMS:  GENERAL:  The patient denies any fever, chills, night sweats, or bleeding.  CNS:  No blurred, double vision, seizure, headache, or paralysis.  RESPIRATORY:  No  shortness of breath, productive cough, or hemoptysis.  CARDIOVASCULAR:  No chest pain. History of orthopnea.  GU:  No dysuria, hematuria, discharge.  GI: No nausea, vomiting, diarrhea, constipation, melena.  MUSCULOSKELETAL:  As per the HPI.  PHYSICAL EXAMINATION:  VITAL SIGNS:  Pulse is 90, respiratory rate 10, and BP 150/100. GENERAL:  This overweight female, seen upright, in no acute distress. She does walk with antalgic gait. HEENT:  Atraumatic, normocephalic. NECK:  Supple.  No lymphadenopathy. CHEST:  She does have some mild wheezes in the right upper lobe, otherwise clear. BREASTS/GU:  Not examined. HEART:  Regular rate and rhythm without murmurs, gallops, or rubs. ABDOMEN:  Soft, nontender, protuberant.  Bowel sounds x4.  SKIN:  No rashes or lesions are noted in regard to the knee.  She has a fairly large girth to her leg.  There is mild effusion noted.  She is tender on the medial joint line.  Range of motion is -5 to 100 degrees.  IMPRESSION:  Bone-on-bone osteoarthritis of the right knee.  PLAN:  The patient will be admitted to undergo a right total knee arthroplasty.     Roma Schanz, P.A.   ______________________________ Jene Every, M.D.    CS/MEDQ  D:  08/06/2011  T:  08/06/2011  Job:  [id number]  Electronically Signed by Roma Schanz P.A. on 08/08/2011 09:15:26 PM Electronically Signed by Jene Every M.D. on 08/12/2011 01:25:39 PM

## 2011-08-14 ENCOUNTER — Telehealth: Payer: Self-pay

## 2011-08-14 NOTE — Telephone Encounter (Signed)
Limit your sodium (Salt) intake Continue to monitor blood pressure if blood pressure is consistently greater than 150/90 needs office visit

## 2011-08-14 NOTE — Telephone Encounter (Signed)
[facility] with [facility] called and stated she was out doing PT with pt due to a total knee replacement and pt has been having high blood pressure readings.  Beth states pt's last reading yesterday was 150/100 and today her reading was 160/90. Pls advise.

## 2011-08-15 NOTE — Telephone Encounter (Signed)
Pt states she does not eat anything salty, red meats and pt does not cook with salt.  Pt states she thinks it is from the pain from her surgery.  Pt states she has never had blood pressure issues before.  Pt states she will monitor her bp readings over the weekend and call if an appt is needed.

## 2011-08-18 ENCOUNTER — Telehealth: Payer: Self-pay | Admitting: *Deleted

## 2011-08-18 ENCOUNTER — Ambulatory Visit (INDEPENDENT_AMBULATORY_CARE_PROVIDER_SITE_OTHER): Payer: PRIVATE HEALTH INSURANCE | Admitting: Internal Medicine

## 2011-08-18 ENCOUNTER — Encounter: Payer: Self-pay | Admitting: Internal Medicine

## 2011-08-18 VITALS — BP 158/100 | HR 78 | Temp 98.8°F

## 2011-08-18 DIAGNOSIS — I10 Essential (primary) hypertension: Secondary | ICD-10-CM

## 2011-08-18 DIAGNOSIS — E669 Obesity, unspecified: Secondary | ICD-10-CM

## 2011-08-18 DIAGNOSIS — Z9889 Other specified postprocedural states: Secondary | ICD-10-CM

## 2011-08-18 DIAGNOSIS — Z8249 Family history of ischemic heart disease and other diseases of the circulatory system: Secondary | ICD-10-CM

## 2011-08-18 MED ORDER — LISINOPRIL-HYDROCHLOROTHIAZIDE 10-12.5 MG PO TABS: 1.0000 | ORAL_TABLET | Freq: Every day | ORAL | Status: DC

## 2011-08-18 NOTE — Progress Notes (Signed)
  Subjective:    Patient ID: Kayla [name], female    DOB: 07-16-1956, 54 y.o.   MRN: [id number]  HPI Asked to see patient today referred in by [facility] because of concern of very hgih bp readings that have interfered with PT. Rehab. Right tkr October 18th  No complications  PT noted elevated bp and comes in ugently to evaluate this. She stated she has never had HT butin the pre op was elevated reading and had elevated bp readings in hospital  140-150/90-100 range  . However no one  Advised particular intervention or FU.  Readings per [facility] 158/110,168,110,168/120 last week .  Both parents had HT> Her pain level is tolerable and not extreme. No complication of surgery at this time.  No nsaid  Allergic to asa.   No tylenol   She is now very worried about all of this and is distressed about possibility of having to take medication long term  Review of Systems NO cp sob bleeding vision changes unusual HAs   Has hx of swelling in the past and has been given lasiz to take prn. None recently. No OSA  Palpitations  No excess etoh.   Past history family history social history reviewed in the electronic medical record.     Objective:   Physical Exam WDWN in nad mildly anxious but healthy appearing  Walks with cane  150/94 right sitting large  152/100 left large sitting. Neck: Supple without adenopathy or masses or bruits Chest:  Clear to A&P without wheezes rales or rhonchi CV:  S1-S2 no gallops or murmurs peripheral perfusion is normal EXT No clubbing cyanosis or edema Right knee incision looks clean and well   Skin: normal capillary refill ,turgor , color: No acute rashes ,petechiae or bruising Reviewed what hosp record available.  Had nl bmet   No hx of renal dysfunction. bp readings in ehr otherwise nl.       Assessment & Plan:  Elevated BP documented No obv secondary cause  fam hx of elevated BP   Anxiety/ pain poss also  Disc options  Cautious about meds Can discuss this with Dcr K  but if really 170 /120 at home then should be treated.  Begin low dose med and fu as above.   Total visit > 50% spent counseling and coordinating care

## 2011-08-18 NOTE — Telephone Encounter (Signed)
Home Health nurse called and is very concerned about Pt's BP this am which is 168/110.  Only appt today was with Dr. Fabian Sharp, so she was scheduled for a visit to control BP.

## 2011-08-18 NOTE — Patient Instructions (Signed)
You bp readings are in the 150 158/90/100 range today.  I do not see obvious cause of elevation and the readings were somewhat  high in the hospital. You r kidney function is normal as of last week.  Pain can elevated BP but doesn't seem to be the problem.   I suggest low dose med  And fu with Dr Kirtland Bouchard.

## 2011-08-20 ENCOUNTER — Encounter: Payer: Self-pay | Admitting: Internal Medicine

## 2011-08-20 ENCOUNTER — Ambulatory Visit: Payer: PRIVATE HEALTH INSURANCE | Admitting: Internal Medicine

## 2011-08-20 DIAGNOSIS — Z9889 Other specified postprocedural states: Secondary | ICD-10-CM | POA: Insufficient documentation

## 2011-08-20 DIAGNOSIS — E669 Obesity, unspecified: Secondary | ICD-10-CM | POA: Insufficient documentation

## 2011-08-26 NOTE — Discharge Summary (Signed)
Kayla, [name]                 ACCOUNT NO.:  [id number]  MEDICAL RECORD NO.:  [id number]  LOCATION:  1615                         FACILITY:  [facility]  PHYSICIAN:  Jene Every, M.D.    DATE OF BIRTH:  [birth year]  DATE OF ADMISSION:  08/07/2011 DATE OF DISCHARGE:  08/11/2011                              DISCHARGE SUMMARY   ADMISSION DIAGNOSIS: 1. Degenerative joint disease, right knee. 2. Seasonal allergies. 3. Gastroesophageal reflux disease.  DISCHARGE DIAGNOSIS: 1. Degenerative joint disease, right knee. 2. Seasonal allergies. 3. Gastroesophageal reflux disease. 4. Status post right total knee arthroplasty.  HISTORY:  Kayla [name] is a pleasant 55 year old female with longstanding history of knee pain.  She has undergone conservative treatment including injections without significant relief.  Radiographs do reveal bone-on-bone osteoarthritis.  The patient describes her pain as disabling and so at this time, she would benefit from a total knee arthroplasty.  The risks and benefits of the surgery were discussed with the patient.  She does wish to proceed.  PROCEDURE:  The patient was taken to the OR, and underwent right total knee arthroplasty.  SURGEON:  Jene Every, M.D.  ASSISTANT:  Roma Schanz, P.A.  ANESTHESIA:  General.  COMPLICATIONS:  None.  CONSULT:  PT/OT and Care Management.  LABORATORY DATA:  Admission labs showed a white cell count 7.1, hemoglobin 11.1, hematocrit 32.8.  This was monitored throughout the hospital stay.  White cell count remained normal, at time of discharge 5.0.  Hemoglobin remained stable at 10.5 with hematocrit 32.6.  Routine chemistries obtained during the hospital stay were within normal range. GFR is greater than 90.  HOSPITAL COURSE:  The patient was admitted, taken to the OR, underwent the above-stated procedure without difficulty.  One Hemovac drain was placed intraoperatively.  She was then transferred to the PACU and  then to the orthopedic floor for continued postoperative care.  She was placed on PCA for pain relief.  Postop day #1, the patient was doing fairly well.  She did have some nausea and itching secondary to PCA. Vital signs were stable.  Hemovac was removed.  Tip was intact.  Motor neurovascular function intact in lower extremity.  PT/OT was consulted. Xarelto was started per Pharmacy.  Discharge planning was initiated. Postop day #2, dressing was changed.  Incision was clean, dry, and intact.  Motor neurovascular function was intact in lower extremity. Xarelto was continued per Pharmacy.  The patient did progress well following PT.  Postop day #3, so at this time, the patient was stable to be discharged home.  DISPOSITION:  The patient discharged home with all home health needs met.  FOLLOWUP:  She is to follow up with Dr. Shelle Iron in approximately 10-14 days for suture removal and x-ray.  ACTIVITY:  She is to ambulate as tolerated utilizing knee brace until she can straight leg raise x10.  Continue to ice and elevate at least 6 times a day.  MEDICATIONS:  As per med rec sheet, Xarelto for a total of 21 days.  DIET:  As tolerated.  CONDITION ON DISCHARGE:  Stable.  FINAL DIAGNOSIS:  Doing well status post right total knee arthroplasty.     Wynona Canes  Iran Ouch, P.A.   ______________________________ Jene Every, M.D.    CS/MEDQ  D:  08/26/2011  T:  08/26/2011  Job:  [id number]

## 2011-08-29 ENCOUNTER — Encounter: Payer: Self-pay | Admitting: Internal Medicine

## 2011-08-29 ENCOUNTER — Ambulatory Visit (INDEPENDENT_AMBULATORY_CARE_PROVIDER_SITE_OTHER): Payer: PRIVATE HEALTH INSURANCE | Admitting: Internal Medicine

## 2011-08-29 DIAGNOSIS — I10 Essential (primary) hypertension: Secondary | ICD-10-CM

## 2011-08-29 MED ORDER — LISINOPRIL-HYDROCHLOROTHIAZIDE 10-12.5 MG PO TABS: 1.0000 | ORAL_TABLET | Freq: Every day | ORAL | Status: DC

## 2011-08-29 NOTE — Progress Notes (Signed)
  Subjective:    Patient ID: Kayla [name], female    DOB: 06-18-1956, 55 y.o.   MRN: [id number]  HPI  55 year old patient who is seen today for followup of her hypertension. She is status post right total knee replacement therapy on October 18. In the postoperative period she has had some labile blood pressure readings as high as 170/120. Due to the consistently high readings  that interfered with her postoperative physical therapy she was placed on combination therapy earlier. She has tolerated this medication well. 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 She remains on DVT prophylaxis.   Review of Systems  Musculoskeletal: Positive for joint swelling and gait problem.       Objective:   Physical Exam  Constitutional: She appears well-developed and well-nourished. No distress.       Morbidly obese. Blood pressure 122/80  Skin:       Surgical scar right knee nicely healing          Assessment & Plan:   Postop hypertension. We'll continue present regimen at this point and reevaluate in 3 months. May consider a trial of blood pressure medication at that time

## 2011-08-29 NOTE — Patient Instructions (Signed)
Please check your blood pressure on a regular basis.  If it is consistently greater than 150/90, please make an office appointment.  Limit your sodium (Salt) intake    It is important that you exercise regularly, at least 20 minutes 3 to 4 times per week.  If you develop chest pain or shortness of breath seek  medical attention.   

## 2011-09-19 ENCOUNTER — Ambulatory Visit: Payer: PRIVATE HEALTH INSURANCE | Admitting: Internal Medicine

## 2011-09-22 ENCOUNTER — Encounter: Payer: Self-pay | Admitting: Internal Medicine

## 2011-09-22 ENCOUNTER — Ambulatory Visit (INDEPENDENT_AMBULATORY_CARE_PROVIDER_SITE_OTHER): Payer: PRIVATE HEALTH INSURANCE | Admitting: Internal Medicine

## 2011-09-22 VITALS — BP 110/80 | Temp 98.1°F | Wt 292.0 lb

## 2011-09-22 DIAGNOSIS — E669 Obesity, unspecified: Secondary | ICD-10-CM

## 2011-09-22 DIAGNOSIS — I10 Essential (primary) hypertension: Secondary | ICD-10-CM

## 2011-09-22 DIAGNOSIS — Z23 Encounter for immunization: Secondary | ICD-10-CM

## 2011-09-22 DIAGNOSIS — Z0000 Encounter for general adult medical examination without abnormal findings: Secondary | ICD-10-CM

## 2011-09-22 NOTE — Patient Instructions (Signed)
Limit your sodium (Salt) intake    It is important that you exercise regularly, at least 20 minutes 3 to 4 times per week.  If you develop chest pain or shortness of breath seek  medical attention.  You need to lose weight.  Consider a lower calorie diet and regular exercise.  Please check your blood pressure on a regular basis.  If it is consistently greater than 140/90, please make an office appointment.   Return in 6 months for follow-up  

## 2011-09-22 NOTE — Progress Notes (Signed)
  Subjective:    Patient ID: Kayla [name], female    DOB: 28-Apr-1956, 55 y.o.   MRN: [id number]  HPI   55 year old patient who has a history of obesity and is status post right total knee replacement surgery.  She was noted2 hypertension postoperatively and presently is on combination blood pressure medication. Blood pressure readings have been very well controlled and she tolerates this medication quite well. She had no prior history of hypertension. No recent blood pressure readings. She is recovering nicely from her surgery and is gradually becoming more active. There has been some modest weight loss.    Review of Systems  Musculoskeletal: Positive for gait problem (status post right total knee replacement).       Objective:   Physical Exam  Constitutional: She appears well-developed and well-nourished. No distress.       Weight 292 Blood pressure 110/70          Assessment & Plan:    Hypertension well controlled on medication. Options were discussed. It is elected to hold her depression medication and to follow blood pressure readings closely as an outpatient. She will obtain her own blood pressure monitor. She will resume medication if blood pressure is consistently in excess of 135/90

## 2011-10-13 ENCOUNTER — Telehealth: Payer: Self-pay

## 2011-10-27 NOTE — Telephone Encounter (Signed)
error 

## 2011-11-11 LAB — HM MAMMOGRAPHY

## 2011-12-01 ENCOUNTER — Encounter: Payer: Self-pay | Admitting: Internal Medicine

## 2011-12-05 ENCOUNTER — Encounter: Payer: Self-pay | Admitting: Internal Medicine

## 2011-12-26 ENCOUNTER — Ambulatory Visit (INDEPENDENT_AMBULATORY_CARE_PROVIDER_SITE_OTHER): Payer: PRIVATE HEALTH INSURANCE | Admitting: Internal Medicine

## 2011-12-26 ENCOUNTER — Encounter: Payer: Self-pay | Admitting: Internal Medicine

## 2011-12-26 DIAGNOSIS — R05 Cough: Secondary | ICD-10-CM

## 2011-12-26 DIAGNOSIS — I10 Essential (primary) hypertension: Secondary | ICD-10-CM

## 2011-12-26 DIAGNOSIS — J069 Acute upper respiratory infection, unspecified: Secondary | ICD-10-CM

## 2011-12-26 DIAGNOSIS — R059 Cough, unspecified: Secondary | ICD-10-CM

## 2011-12-26 MED ORDER — HYDROCODONE-HOMATROPINE 5-1.5 MG/5ML PO SYRP: 5.0000 mL | ORAL_SOLUTION | Freq: Four times a day (QID) | ORAL | Status: AC | PRN

## 2011-12-26 NOTE — Progress Notes (Signed)
  Subjective:    Patient ID: Kayla [name], female    DOB: 01-22-1956, 56 y.o.   MRN: [id number]  HPI 56 year old patient who presents with a 2 week history of nonproductive cough chest tightness and congestion. She does have a history of allergic rhinitis. There's been no fever or purulent sputum production. Postoperatively she was noted to have some hypertension at last visit her blood pressure medication was placed on hold she has checked her blood pressure infrequently with normal results. Blood pressure on arrival here was moderately high.     Review of Systems  Constitutional: Negative.   HENT: Positive for congestion and rhinorrhea. Negative for hearing loss, sore throat, dental problem, sinus pressure and tinnitus.   Eyes: Negative for pain, discharge and visual disturbance.  Respiratory: Positive for cough. Negative for shortness of breath.   Cardiovascular: Negative for chest pain, palpitations and leg swelling.  Gastrointestinal: Negative for nausea, vomiting, abdominal pain, diarrhea, constipation, blood in stool and abdominal distention.  Genitourinary: Negative for dysuria, urgency, frequency, hematuria, flank pain, vaginal bleeding, vaginal discharge, difficulty urinating, vaginal pain and pelvic pain.  Musculoskeletal: Negative for joint swelling, arthralgias and gait problem.  Skin: Negative for rash.  Neurological: Negative for dizziness, syncope, speech difficulty, weakness, numbness and headaches.  Hematological: Negative for adenopathy.  Psychiatric/Behavioral: Negative for behavioral problems, dysphoric mood and agitation. The patient is not nervous/anxious.        Objective:   Physical Exam  Constitutional: She is oriented to person, place, and time. She appears well-developed and well-nourished.       Overweight repeat blood pressure 140/92   HENT:  Head: Normocephalic.  Right Ear: External ear normal.  Left Ear: External ear normal.  Mouth/Throat: Oropharynx  is clear and moist.  Eyes: Conjunctivae and EOM are normal. Pupils are equal, round, and reactive to light.  Neck: Normal range of motion. Neck supple. No thyromegaly present.  Cardiovascular: Normal rate, regular rhythm, normal heart sounds and intact distal pulses.   Pulmonary/Chest: Effort normal and breath sounds normal.  Abdominal: Soft. Bowel sounds are normal. She exhibits no mass. There is no tenderness.  Musculoskeletal: Normal range of motion.  Lymphadenopathy:    She has no cervical adenopathy.  Neurological: She is alert and oriented to person, place, and time.  Skin: Skin is warm and dry. No rash noted.  Psychiatric: She has a normal mood and affect. Her behavior is normal.          Assessment & Plan:  Viral URI. Will treat symptomatically Exogenous obesity Rule out sustained hypertension. Have asked the patient to monitor blood pressure more carefully and to obtain a home blood pressure monitor. His blood pressure is consistently greater than 140/90 will resume treatment

## 2011-12-26 NOTE — Patient Instructions (Signed)
Limit your sodium (Salt) intake    It is important that you exercise regularly, at least 20 minutes 3 to 4 times per week.  If you develop chest pain or shortness of breath seek  medical attention.  Please check your blood pressure on a regular basis.  If it is consistently greater than 140/90, please make an office appointment.  Get plenty of rest, Drink lots of  clear liquids, and use Tylenol or ibuprofen for fever and discomfort.    Mucinex DM one twice daily

## 2012-03-22 ENCOUNTER — Ambulatory Visit: Payer: PRIVATE HEALTH INSURANCE | Admitting: Internal Medicine

## 2012-05-03 ENCOUNTER — Ambulatory Visit (INDEPENDENT_AMBULATORY_CARE_PROVIDER_SITE_OTHER): Payer: PRIVATE HEALTH INSURANCE | Admitting: Internal Medicine

## 2012-05-03 ENCOUNTER — Encounter: Payer: Self-pay | Admitting: Internal Medicine

## 2012-05-03 VITALS — BP 140/90 | Temp 98.2°F

## 2012-05-03 DIAGNOSIS — K219 Gastro-esophageal reflux disease without esophagitis: Secondary | ICD-10-CM

## 2012-05-03 DIAGNOSIS — I10 Essential (primary) hypertension: Secondary | ICD-10-CM

## 2012-05-03 MED ORDER — FUROSEMIDE 40 MG PO TABS: 40.0000 mg | ORAL_TABLET | Freq: Every day | ORAL | Status: DC | PRN

## 2012-05-03 NOTE — Patient Instructions (Signed)
Limit your sodium (Salt) intake  Please check your blood pressure on a regular basis.  If it is consistently greater than 150/90, please make an office appointment.  Avoids foods high in acid such as tomatoes citrus juices, and spicy foods.  Avoid eating within two hours of lying down or before exercising.  Do not overheat.  Try smaller more frequent meals.  If symptoms persist, elevate the head of her bed 12 inches while sleeping.  Return in 6 months for follow-up Diet for GERD or PUD Nutrition therapy can help ease the discomfort of gastroesophageal reflux disease (GERD) and peptic ulcer disease (PUD).   HOME CARE INSTRUCTIONS    Eat your meals slowly, in a relaxed setting.   Eat 5 to 6 small meals per day.   If a food causes distress, stop eating it for a period of time.  FOODS TO AVOID  Coffee, regular or decaffeinated.   Cola beverages, regular or low calorie.   Tea, regular or decaffeinated.   Pepper.   Cocoa.   High fat foods, including meats.   Butter, margarine, hydrogenated oil (trans fats).   Peppermint or spearmint (if you have GERD).   Fruits and vegetables if not tolerated.   Alcohol.   Nicotine (smoking or chewing). This is one of the most potent stimulants to acid production in the gastrointestinal tract.   Any food that seems to aggravate your condition.  If you have questions regarding your diet, ask your caregiver or a registered dietitian. TIPS  Lying flat may make symptoms worse. Keep the head of your bed raised 6 to 9 inches (15 to 23 cm) by using a foam wedge or blocks under the legs of the bed.   Do not lay down until 3 hours after eating a meal.   Daily physical activity may help reduce symptoms.  MAKE SURE YOU:    Understand these instructions.   Will watch your condition.   Will get help right away if you are not doing well or get worse.  Document Released: 10/06/2005 Document Revised: 09/25/2011 Document Reviewed:  08/22/2011 [facility] Patient Information 2012 [place], [place].

## 2012-05-03 NOTE — Progress Notes (Signed)
  Subjective:    Patient ID: Kayla [name], female    DOB: 06/25/1956, 56 y.o.   MRN: [id number]  HPI  56 -year-old patient who is seen today for followup. She has a history of hypertension but presently is being monitored without medication. She does follow a regular exercise program at the present time and there's been some modest weight loss. She does track home blood pressure readings and usually is in the 120/80 range. She rarely is as high as 140/90 as she is this morning. She has a history of exogenous obesity status post lap band surgery. She has significant reflux symptoms that are managed well with diet and when necessary Zantac.    Review of Systems  Constitutional: Negative.   HENT: Negative for hearing loss, congestion, sore throat, rhinorrhea, dental problem, sinus pressure and tinnitus.   Eyes: Negative for pain, discharge and visual disturbance.  Respiratory: Negative for cough and shortness of breath.   Cardiovascular: Negative for chest pain, palpitations and leg swelling.  Gastrointestinal: Negative for nausea, vomiting, abdominal pain, diarrhea, constipation, blood in stool and abdominal distention.  Genitourinary: Negative for dysuria, urgency, frequency, hematuria, flank pain, vaginal bleeding, vaginal discharge, difficulty urinating, vaginal pain and pelvic pain.  Musculoskeletal: Negative for joint swelling, arthralgias and gait problem.  Skin: Negative for rash.  Neurological: Negative for dizziness, syncope, speech difficulty, weakness, numbness and headaches.  Hematological: Negative for adenopathy.  Psychiatric/Behavioral: Negative for behavioral problems, dysphoric mood and agitation. The patient is not nervous/anxious.        Objective:   Physical Exam  Constitutional: She appears well-developed and well-nourished. No distress.       Obese Blood pressure repeat 140/90  HENT:  Mouth/Throat: Oropharynx is clear and moist.  Eyes: EOM are normal. Pupils are  equal, round, and reactive to light.  Neck: Normal range of motion. No JVD present. No thyromegaly present.  Cardiovascular: Normal rate and regular rhythm.   Pulmonary/Chest: Effort normal and breath sounds normal.          Assessment & Plan:   History of hypertension. We'll continue home blood pressure monitoring at present weight loss and exercise. Low salt diet recommended. We'll continue to observe off medication Exogenous obesity status post LAP-BAND surgery GERD. We'll continue aggressive antireflux regimen   Recheck 6 months

## 2012-06-14 ENCOUNTER — Encounter: Payer: Self-pay | Admitting: Internal Medicine

## 2012-06-14 ENCOUNTER — Telehealth: Payer: Self-pay | Admitting: Internal Medicine

## 2012-06-14 ENCOUNTER — Ambulatory Visit (INDEPENDENT_AMBULATORY_CARE_PROVIDER_SITE_OTHER): Payer: PRIVATE HEALTH INSURANCE | Admitting: Internal Medicine

## 2012-06-14 VITALS — BP 140/120 | Temp 98.2°F

## 2012-06-14 DIAGNOSIS — I10 Essential (primary) hypertension: Secondary | ICD-10-CM

## 2012-06-14 DIAGNOSIS — J309 Allergic rhinitis, unspecified: Secondary | ICD-10-CM

## 2012-06-14 MED ORDER — METHYLPREDNISOLONE ACETATE 80 MG/ML IJ SUSP
80.0000 mg | Freq: Once | INTRAMUSCULAR | Status: AC
  Administered 2012-06-14: 80 mg via INTRAMUSCULAR

## 2012-06-14 MED ORDER — TRAMADOL HCL 50 MG PO TABS: 50.0000 mg | ORAL_TABLET | Freq: Three times a day (TID) | ORAL | Status: AC | PRN

## 2012-06-14 MED ORDER — FLUTICASONE PROPIONATE 50 MCG/ACT NA SUSP: 2.0000 | Freq: Every day | NASAL | Status: DC

## 2012-06-14 NOTE — Patient Instructions (Signed)
Please check your blood pressure on a regular basis.  If it is consistently greater than 150/90, please make an office appointment.  Limit your sodium (Salt) intake    It is important that you exercise regularly, at least 20 minutes 3 to 4 times per week.  If you develop chest pain or shortness of breath seek  medical attention.  You need to lose weight.  Consider a lower calorie diet and regular exercise.   Use saline irrigation, warm  moist compresses and over-the-counter decongestants only as directed.  Call if there is no improvement in 5 to 7 days, or sooner if you develop increasing pain, fever, or any new symptoms.

## 2012-06-14 NOTE — Telephone Encounter (Signed)
Has appt today 1015am

## 2012-06-14 NOTE — Telephone Encounter (Signed)
Caller: Erik/Patient;  PCP: Eleonore Chiquito;Onset of sinus issues 2-3 weeks ago with progressive worsening; Dizziness started 06/12/12; Sinus drainage-clear; Afebrile; coughing at intervals; nose is raw with sores in nose with Headaches; Has tried home remedies and has not improved.  LMP-hysterectomy; Triaged using Upper Respiratory Infection with a disposition to see provider within 24 hours due to symptoms worsen after 7 days or symptoms do not improve after 14 days of home care.    Appointment made with Dr. Wynetta Emery @ 10:15 on 06/14/12.

## 2012-06-14 NOTE — Progress Notes (Signed)
Subjective:    Patient ID: Kayla [name], female    DOB: [birth year], 56 y.o.   MRN: [id number]  HPI  BP Readings from Last 3 Encounters:  06/14/12 140/120  05/03/12 140/90  12/26/11 15/29    56 year old patient who has a history of allergic rhinitis. She has been treated with immunotherapy in the past for the past week she's had sinus congestion dizziness and occasional mild vertigo she's had cough postnasal drip and some ear discomfort. Some symptoms have been present for as long as 3 weeks.  She does monitor blood pressure readings at home and she states they're often in a low-normal range. Last month blood pressure was high normal. She denies any decongestant use or anti-inflammatory drugs.  Past Medical History  Diagnosis Date  . GERD 11/11/2010  . Hematemesis 11/11/2010  . VASOVAGAL SYNCOPE 11/11/2010  . Obesity   . Diabetes mellitus     History   Social History  . Marital Status: Married    Spouse Name: N/A    Number of Children: N/A  . Years of Education: N/A   Occupational History  . Not on file.   Social History Main Topics  . Smoking status: Never Smoker   . Smokeless tobacco: Never Used  . Alcohol Use: No  . Drug Use: No  . Sexually Active: Not on file   Other Topics Concern  . Not on file   Social History Narrative  . No narrative on file    Past Surgical History  Procedure Date  . Abdominal hysterectomy   . Cholecystectomy   . Laparoscopic gastric banding   . Knee arthroscopy     left  . 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 Dementia Mother   . Asthma Paternal Grandmother   . Hypertension Mother   . Hypertension Father     Allergies  Allergen Reactions  . Asa Arthritis Strength-Antacid (Aspirin Buffered)   . Cefaclor   . Morphine   . Penicillins   . Tetracycline     Current Outpatient Prescriptions on File Prior to Visit  Medication Sig Dispense Refill  .  acetaminophen (TYLENOL) 500 MG tablet Take 500 mg by mouth every 6 (six) hours as needed.        . fexofenadine (ALLEGRA) 180 MG tablet Take 180 mg by mouth daily.      . furosemide (LASIX) 40 MG tablet Take 1 tablet (40 mg total) by mouth daily as needed. swelling  30 tablet  4  . Multiple Vitamin (MULTIVITAMIN) tablet Take 1 tablet by mouth daily.        . ranitidine (ZANTAC) 75 MG tablet Take 75 mg by mouth 2 (two) times daily.        BP 140/120  Temp 98.2 F (36.8 C) (Oral)       Review of Systems  Constitutional: Negative.   HENT: Positive for congestion and postnasal drip. Negative for hearing loss, sore throat, rhinorrhea, dental problem, sinus pressure and tinnitus.   Eyes: Negative for pain, discharge and visual disturbance.  Respiratory: Positive for cough. Negative for shortness of breath.   Cardiovascular: Negative for chest pain, palpitations and leg swelling.  Gastrointestinal: Negative for nausea, vomiting, abdominal pain, diarrhea, constipation, blood in stool and abdominal distention.  Genitourinary: Negative for dysuria, urgency, frequency, hematuria, flank pain, vaginal bleeding, vaginal discharge, difficulty urinating, vaginal pain and pelvic pain.  Musculoskeletal: Negative for  joint swelling, arthralgias and gait problem.  Skin: Negative for rash.  Neurological: Positive for headaches. Negative for dizziness, syncope, speech difficulty, weakness and numbness.  Hematological: Negative for adenopathy.  Psychiatric/Behavioral: Negative for behavioral problems, dysphoric mood and agitation. The patient is not nervous/anxious.        Objective:   Physical Exam  Constitutional: She is oriented to person, place, and time. She appears well-developed and well-nourished.       Obese No distress Blood pressure 160/100  HENT:  Head: Normocephalic.  Right Ear: External ear normal.  Left Ear: External ear normal.  Mouth/Throat: Oropharynx is clear and moist.  Eyes:  Conjunctivae and EOM are normal. Pupils are equal, round, and reactive to light.  Neck: Normal range of motion. Neck supple. No thyromegaly present.  Cardiovascular: Normal rate, regular rhythm, normal heart sounds and intact distal pulses.   Pulmonary/Chest: Effort normal and breath sounds normal.  Abdominal: Soft. Bowel sounds are normal. She exhibits no mass. There is no tenderness.  Musculoskeletal: Normal range of motion.  Lymphadenopathy:    She has no cervical adenopathy.  Neurological: She is alert and oriented to person, place, and time.  Skin: Skin is warm and dry. No rash noted.  Psychiatric: She has a normal mood and affect. Her behavior is normal.          Assessment & Plan:   Allergic rhinitis. Flare we'll treat with Depo-Medrol  Rule out sustained  Hypertension.  Will monitor blood pressures over the next 2 weeks and return at that time for followup. [name] been asked to bring her home blood pressure monitor low-salt diet recommended

## 2012-06-28 ENCOUNTER — Encounter: Payer: Self-pay | Admitting: Internal Medicine

## 2012-06-28 ENCOUNTER — Ambulatory Visit (INDEPENDENT_AMBULATORY_CARE_PROVIDER_SITE_OTHER): Payer: PRIVATE HEALTH INSURANCE | Admitting: Internal Medicine

## 2012-06-28 VITALS — BP 140/96 | Temp 98.1°F

## 2012-06-28 DIAGNOSIS — J309 Allergic rhinitis, unspecified: Secondary | ICD-10-CM

## 2012-06-28 DIAGNOSIS — I10 Essential (primary) hypertension: Secondary | ICD-10-CM

## 2012-06-28 MED ORDER — LISINOPRIL-HYDROCHLOROTHIAZIDE 20-12.5 MG PO TABS: 1.0000 | ORAL_TABLET | Freq: Every day | ORAL | Status: DC

## 2012-06-28 NOTE — Patient Instructions (Signed)
Limit your sodium (Salt) intake  Please check your blood pressure on a regular basis.  If it is consistently greater than 150/90, please make an office appointment.  Return in 3 months for follow-up Arterial Hypertension Arterial hypertension (high blood pressure) is a condition of elevated pressure in your blood vessels. Hypertension over a long period of time is a risk factor for strokes, heart attacks, and heart failure. It is also the leading cause of kidney (renal) failure.   CAUSES    In Adults -- Over 90% of all hypertension has no known cause. This is called essential or primary hypertension. In the other 10% of people with hypertension, the increase in blood pressure is caused by another disorder. This is called secondary hypertension. Important causes of secondary hypertension are:   Heavy alcohol use.   Obstructive sleep apnea.   Hyperaldosterosim (Conn's syndrome).   Steroid use.   Chronic kidney failure.   Hyperparathyroidism.   Medications.   Renal artery stenosis.   Pheochromocytoma.   Cushing's disease.   Coarctation of the aorta.   Scleroderma renal crisis.   Licorice (in excessive amounts).   Drugs (cocaine, methamphetamine).  Your caregiver can explain any items above that apply to you.  In Children -- Secondary hypertension is more common and should always be considered.   Pregnancy -- Few women of childbearing age have high blood pressure. However, up to 10% of them develop hypertension of pregnancy. Generally, this will not harm the woman. It may be a sign of 3 complications of pregnancy: preeclampsia, HELLP syndrome, and eclampsia. Follow up and control with medication is necessary.  SYMPTOMS    This condition normally does not produce any noticeable symptoms. It is usually found during a routine exam.   Malignant hypertension is a late problem of high blood pressure. It may have the following symptoms:   Headaches.   Blurred vision.    End-organ damage (this means your kidneys, heart, lungs, and other organs are being damaged).   Stressful situations can increase the blood pressure. If a person with normal blood pressure has their blood pressure go up while being seen by their caregiver, this is often termed "white coat hypertension." Its importance is not known. It may be related with eventually developing hypertension or complications of hypertension.   Hypertension is often confused with mental tension, stress, and anxiety.  DIAGNOSIS   The diagnosis is made by 3 separate blood pressure measurements. They are taken at least 1 week apart from each other. If there is organ damage from hypertension, the diagnosis may be made without repeat measurements. Hypertension is usually identified by having blood pressure readings:  Above 140/90 mmHg measured in both arms, at 3 separate times, over a couple weeks.   Over 130/80 mmHg should be considered a risk factor and may require treatment in patients with diabetes.  Blood pressure readings over 120/80 mmHg are called "pre-hypertension" even in non-diabetic patients. To get a true blood pressure measurement, use the following guidelines. Be aware of the factors that can alter blood pressure readings.  Take measurements at least 1 hour after caffeine.   Take measurements 30 minutes after smoking and without any stress. This is another reason to quit smoking - it raises your blood pressure.   Use a proper cuff size. Ask your caregiver if you are not sure about your cuff size.   Most home blood pressure cuffs are automatic. They will measure systolic and diastolic pressures. The systolic pressure is the pressure  reading at the start of sounds. Diastolic pressure is the pressure at which the sounds disappear. If you are elderly, measure pressures in multiple postures. Try sitting, lying or standing.   Sit at rest for a minimum of 5 minutes before taking measurements.   You should  not be on any medications like decongestants. These are found in many cold medications.   Record your blood pressure readings and review them with your caregiver.  If you have hypertension:  Your caregiver may do tests to be sure you do not have secondary hypertension (see "causes" above).   Your caregiver may also look for signs of metabolic syndrome. This is also called Syndrome X or Insulin Resistance Syndrome. You may have this syndrome if you have type 2 diabetes, abdominal obesity, and abnormal blood lipids in addition to hypertension.   Your caregiver will take your medical and family history and perform a physical exam.   Diagnostic tests may include blood tests (for glucose, cholesterol, potassium, and kidney function), a urinalysis, or an EKG. Other tests may also be necessary depending on your condition.  PREVENTION   There are important lifestyle issues that you can adopt to reduce your chance of developing hypertension:  Maintain a normal weight.   Limit the amount of salt (sodium) in your diet.   Exercise often.   Limit alcohol intake.   Get enough potassium in your diet. Discuss specific advice with your caregiver.   Follow a DASH diet (dietary approaches to stop hypertension). This diet is rich in fruits, vegetables, and low-fat dairy products, and avoids certain fats.  PROGNOSIS   Essential hypertension cannot be cured. Lifestyle changes and medical treatment can lower blood pressure and reduce complications. The prognosis of secondary hypertension depends on the underlying cause. Many people whose hypertension is controlled with medicine or lifestyle changes can live a normal, healthy life.   RISKS AND COMPLICATIONS   While high blood pressure alone is not an illness, it often requires treatment due to its short- and long-term effects on many organs. Hypertension increases your risk for:  CVAs or strokes (cerebrovascular accident).   Heart failure due to chronically  high blood pressure (hypertensive cardiomyopathy).   Heart attack (myocardial infarction).   Damage to the retina (hypertensive retinopathy).   Kidney failure (hypertensive nephropathy).  Your caregiver can explain list items above that apply to you. Treatment of hypertension can significantly reduce the risk of complications. TREATMENT    For overweight patients, weight loss and regular exercise are recommended. Physical fitness lowers blood pressure.   Mild hypertension is usually treated with diet and exercise. A diet rich in fruits and vegetables, fat-free dairy products, and foods low in fat and salt (sodium) can help lower blood pressure. Decreasing salt intake decreases blood pressure in a 1/3 of people.   Stop smoking if you are a smoker.  The steps above are highly effective in reducing blood pressure. While these actions are easy to suggest, they are difficult to achieve. Most patients with moderate or severe hypertension end up requiring medications to bring their blood pressure down to a normal level. There are several classes of medications for treatment. Blood pressure pills (antihypertensives) will lower blood pressure by their different actions. Lowering the blood pressure by 10 mmHg may decrease the risk of complications by as much as 25%. The goal of treatment is effective blood pressure control. This will reduce your risk for complications. Your caregiver will help you determine the best treatment for you according  to your lifestyle. What is excellent treatment for one person, may not be for you. HOME CARE INSTRUCTIONS    Do not smoke.   Follow the lifestyle changes outlined in the "Prevention" section.   If you are on medications, follow the directions carefully. Blood pressure medications must be taken as prescribed. Skipping doses reduces their benefit. It also puts you at risk for problems.   Follow up with your caregiver, as directed.   If you are asked to monitor  your blood pressure at home, follow the guidelines in the "Diagnosis" section above.  SEEK MEDICAL CARE IF:    You think you are having medication side effects.   You have recurrent headaches or lightheadedness.   You have swelling in your ankles.   You have trouble with your vision.  SEEK IMMEDIATE MEDICAL CARE IF:    You have sudden onset of chest pain or pressure, difficulty breathing, or other symptoms of a heart attack.   You have a severe headache.   You have symptoms of a stroke (such as sudden weakness, difficulty speaking, difficulty walking).  MAKE SURE YOU:    Understand these instructions.   Will watch your condition.   Will get help right away if you are not doing well or get worse.  Document Released: 10/06/2005 Document Revised: 09/25/2011 Document Reviewed: 05/06/2007 [facility] Patient Information 2012 [place], [place].DASH Diet The DASH diet stands for "Dietary Approaches to Stop Hypertension." It is a healthy eating plan that has been shown to reduce high blood pressure (hypertension) in as little as 14 days, while also possibly providing other significant health benefits. These other health benefits include reducing the risk of breast cancer after menopause and reducing the risk of type 2 diabetes, heart disease, colon cancer, and stroke. Health benefits also include weight loss and slowing kidney failure in patients with chronic kidney disease.   DIET GUIDELINES  Limit salt (sodium). Your diet should contain less than 1500 mg of sodium daily.   Limit refined or processed carbohydrates. Your diet should include mostly whole grains. Desserts and added sugars should be used sparingly.   Include small amounts of heart-healthy fats. These types of fats include nuts, oils, and tub margarine. Limit saturated and trans fats. These fats have been shown to be harmful in the body.  CHOOSING FOODS   The following food groups are based on a 2000 calorie diet. See your  Registered Dietitian for individual calorie needs. Grains and Grain Products (6 to 8 servings daily)  Eat More Often: Whole-wheat bread, brown rice, whole-grain or wheat pasta, quinoa, popcorn without added fat or salt (air popped).   Eat Less Often: White bread, white pasta, white rice, cornbread.  Vegetables (4 to 5 servings daily)  Eat More Often: Fresh, frozen, and canned vegetables. Vegetables may be raw, steamed, roasted, or grilled with a minimal amount of fat.   Eat Less Often/Avoid: Creamed or fried vegetables. Vegetables in a cheese sauce.  Fruit (4 to 5 servings daily)  Eat More Often: All fresh, canned (in natural juice), or frozen fruits. Dried fruits without added sugar. One hundred percent fruit juice ( cup [237 mL] daily).   Eat Less Often: Dried fruits with added sugar. Canned fruit in light or heavy syrup.  [employer], Fish, and Poultry (2 servings or less daily. One serving is 3 to 4 oz [85-114 g]).  Eat More Often: Ninety percent or leaner ground beef, tenderloin, sirloin. Round cuts of beef, chicken breast, [place] breast. All fish.  Grill, bake, or broil your meat. Nothing should be fried.   Eat Less Often/Avoid: Fatty cuts of meat, [place], or chicken leg, thigh, or wing. Fried cuts of meat or fish.  Dairy (2 to 3 servings)  Eat More Often: Low-fat or fat-free milk, low-fat plain or light yogurt, reduced-fat or part-skim cheese.   Eat Less Often/Avoid: Milk (whole, 2%, skim, or chocolate). Whole milk yogurt. Full-fat cheeses.  Nuts, Seeds, and Legumes (4 to 5 servings per week)  Eat More Often: All without added salt.   Eat Less Often/Avoid: Salted nuts and seeds, canned beans with added salt.  Fats and Sweets (limited)  Eat More Often: Vegetable oils, tub margarines without trans fats, sugar-free gelatin. Mayonnaise and salad dressings.   Eat Less Often/Avoid: Coconut oils, palm oils, butter, stick margarine, cream, half and half, cookies, candy, pie.  FOR  MORE INFORMATION The Dash Diet Eating Plan: www.dashdiet.org Document Released: 09/25/2011 Document Reviewed: 09/15/2011 [facility] Patient Information 2012 [place], [place].

## 2012-06-28 NOTE — Progress Notes (Signed)
Subjective:    Patient ID: Kayla [name], female    DOB: 29-Dec-1955, 56 y.o.   MRN: [id number]  HPI  56 year old patient who is seen today for followup of her hypertension. Blood pressure was elevated 2 weeks ago and for the past 2 weeks that has been monitoring home blood pressure readings. They are rarely normal and often in a stage II range. She feels quite well. Family history is positive hypertension. She has been quite resistant to medications. Benefits of therapy discussed at length  Past Medical History  Diagnosis Date  . GERD 11/11/2010  . Hematemesis 11/11/2010  . VASOVAGAL SYNCOPE 11/11/2010  . Obesity   . Diabetes mellitus     History   Social History  . Marital Status: Married    Spouse Name: N/A    Number of Children: N/A  . Years of Education: N/A   Occupational History  . Not on file.   Social History Main Topics  . Smoking status: Never Smoker   . Smokeless tobacco: Never Used  . Alcohol Use: No  . Drug Use: No  . Sexually Active: Not on file   Other Topics Concern  . Not on file   Social History Narrative  . No narrative on file    Past Surgical History  Procedure Date  . Abdominal hysterectomy   . Cholecystectomy   . Laparoscopic gastric banding   . Knee arthroscopy     left  . 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 Dementia Mother   . Asthma Paternal Grandmother   . Hypertension Mother   . Hypertension Father     Allergies  Allergen Reactions  . Asa Arthritis Strength-Antacid (Aspirin Buffered)   . Cefaclor   . Morphine   . Penicillins   . Tetracycline     Current Outpatient Prescriptions on File Prior to Visit  Medication Sig Dispense Refill  . acetaminophen (TYLENOL) 500 MG tablet Take 500 mg by mouth every 6 (six) hours as needed.        . fexofenadine (ALLEGRA) 180 MG tablet Take 180 mg by mouth daily.      . fluticasone (FLONASE) 50 MCG/ACT nasal spray Place 2  sprays into the nose daily.  16 g  6  . furosemide (LASIX) 40 MG tablet Take 1 tablet (40 mg total) by mouth daily as needed. swelling  30 tablet  4  . Multiple Vitamin (MULTIVITAMIN) tablet Take 1 tablet by mouth daily.        . ranitidine (ZANTAC) 75 MG tablet Take 75 mg by mouth 2 (two) times daily.        BP 140/96  Temp 98.1 F (36.7 C) (Oral)       Review of Systems  Constitutional: Negative.   HENT: Negative for hearing loss, congestion, sore throat, rhinorrhea, dental problem, sinus pressure and tinnitus.   Eyes: Negative for pain, discharge and visual disturbance.  Respiratory: Negative for cough and shortness of breath.   Cardiovascular: Negative for chest pain, palpitations and leg swelling.  Gastrointestinal: Negative for nausea, vomiting, abdominal pain, diarrhea, constipation, blood in stool and abdominal distention.  Genitourinary: Negative for dysuria, urgency, frequency, hematuria, flank pain, vaginal bleeding, vaginal discharge, difficulty urinating, vaginal pain and pelvic pain.  Musculoskeletal: Negative for joint swelling, arthralgias and gait problem.  Skin: Negative for rash.  Neurological: Negative for dizziness, syncope, speech difficulty, weakness, numbness and headaches.  Hematological: Negative for adenopathy.  Psychiatric/Behavioral: Negative for behavioral problems, dysphoric mood and agitation. The patient is not nervous/anxious.        Objective:   Physical Exam  Constitutional: She appears well-developed and well-nourished. No distress.       140/94           Assessment & Plan:   Sustained hypertension. Will place on lisinopril hydrochlorothiazide. We'll continue home blood pressure monitoring information concerning hypertension and a DASH diabetes dispensed. Recheck 3 months or sooner if blood pressure is consistently greater than 150/90

## 2012-08-25 ENCOUNTER — Ambulatory Visit (INDEPENDENT_AMBULATORY_CARE_PROVIDER_SITE_OTHER): Payer: PRIVATE HEALTH INSURANCE | Admitting: Internal Medicine

## 2012-08-25 DIAGNOSIS — Z23 Encounter for immunization: Secondary | ICD-10-CM

## 2012-09-09 ENCOUNTER — Ambulatory Visit (INDEPENDENT_AMBULATORY_CARE_PROVIDER_SITE_OTHER): Payer: PRIVATE HEALTH INSURANCE | Admitting: Internal Medicine

## 2012-09-09 DIAGNOSIS — Z23 Encounter for immunization: Secondary | ICD-10-CM

## 2012-09-13 ENCOUNTER — Ambulatory Visit: Payer: PRIVATE HEALTH INSURANCE | Admitting: Internal Medicine

## 2012-09-17 ENCOUNTER — Ambulatory Visit (INDEPENDENT_AMBULATORY_CARE_PROVIDER_SITE_OTHER): Payer: PRIVATE HEALTH INSURANCE | Admitting: *Deleted

## 2012-09-17 DIAGNOSIS — Z23 Encounter for immunization: Secondary | ICD-10-CM

## 2012-09-27 ENCOUNTER — Encounter: Payer: Self-pay | Admitting: Internal Medicine

## 2012-09-27 ENCOUNTER — Ambulatory Visit (INDEPENDENT_AMBULATORY_CARE_PROVIDER_SITE_OTHER): Payer: PRIVATE HEALTH INSURANCE | Admitting: Internal Medicine

## 2012-09-27 VITALS — BP 120/78 | HR 95 | Temp 98.1°F | Resp 18

## 2012-09-27 DIAGNOSIS — K219 Gastro-esophageal reflux disease without esophagitis: Secondary | ICD-10-CM

## 2012-09-27 DIAGNOSIS — E669 Obesity, unspecified: Secondary | ICD-10-CM

## 2012-09-27 DIAGNOSIS — I10 Essential (primary) hypertension: Secondary | ICD-10-CM

## 2012-09-27 MED ORDER — LISINOPRIL-HYDROCHLOROTHIAZIDE 20-12.5 MG PO TABS: 1.0000 | ORAL_TABLET | Freq: Every day | ORAL | Status: DC

## 2012-09-27 NOTE — Progress Notes (Signed)
Subjective:    Patient ID: Kayla [name], female    DOB: November 10, 1955, 56 y.o.   MRN: [id number]  HPI 56 year old patient who is seen today for followup. She has a history of treated hypertension allergic rhinitis. She has done quite well over the past several months. No other concerns or complaints. She has exogenous obesity  Past Medical History  Diagnosis Date  . GERD 11/11/2010  . Hematemesis 11/11/2010  . VASOVAGAL SYNCOPE 11/11/2010  . Obesity   . Diabetes mellitus     History   Social History  . Marital Status: Married    Spouse Name: N/A    Number of Children: N/A  . Years of Education: N/A   Occupational History  . Not on file.   Social History Main Topics  . Smoking status: Never Smoker   . Smokeless tobacco: Never Used  . Alcohol Use: No  . Drug Use: No  . Sexually Active: Not on file   Other Topics Concern  . Not on file   Social History Narrative  . No narrative on file    Past Surgical History  Procedure Date  . Abdominal hysterectomy   . Cholecystectomy   . Laparoscopic gastric banding   . Knee arthroscopy     left  . 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 Dementia Mother   . Asthma Paternal Grandmother   . Hypertension Mother   . Hypertension Father     Allergies  Allergen Reactions  . Asa Arthritis Strength-Antacid (Aspirin Buffered)   . Cefaclor   . Morphine   . Penicillins   . Tetracycline     Current Outpatient Prescriptions on File Prior to Visit  Medication Sig Dispense Refill  . acetaminophen (TYLENOL) 500 MG tablet Take 500 mg by mouth every 6 (six) hours as needed.        . fexofenadine (ALLEGRA) 180 MG tablet Take 180 mg by mouth daily.      . fluticasone (FLONASE) 50 MCG/ACT nasal spray Place 2 sprays into the nose daily.  16 g  6  . furosemide (LASIX) 40 MG tablet Take 1 tablet (40 mg total) by mouth daily as needed. swelling  30 tablet  4  . Multiple Vitamin  (MULTIVITAMIN) tablet Take 1 tablet by mouth daily.        . ranitidine (ZANTAC) 75 MG tablet Take 75 mg by mouth 2 (two) times daily.        BP 120/78  Pulse 95  Temp 98.1 F (36.7 C) (Oral)  Resp 18  SpO2 96%      .   Review of Systems  Constitutional: Negative.   HENT: Negative for hearing loss, congestion, sore throat, rhinorrhea, dental problem, sinus pressure and tinnitus.   Eyes: Negative for pain, discharge and visual disturbance.  Respiratory: Negative for cough and shortness of breath.   Cardiovascular: Negative for chest pain, palpitations and leg swelling.  Gastrointestinal: Negative for nausea, vomiting, abdominal pain, diarrhea, constipation, blood in stool and abdominal distention.  Genitourinary: Negative for dysuria, urgency, frequency, hematuria, flank pain, vaginal bleeding, vaginal discharge, difficulty urinating, vaginal pain and pelvic pain.  Musculoskeletal: Negative for joint swelling, arthralgias and gait problem.  Skin: Negative for rash.  Neurological: Negative for dizziness, syncope, speech difficulty, weakness, numbness and headaches.  Hematological: Negative for adenopathy.  Psychiatric/Behavioral: Negative for behavioral problems, dysphoric mood and agitation. The patient is not  nervous/anxious.        Objective:   Physical Exam  Constitutional: She is oriented to person, place, and time. She appears well-developed and well-nourished.  HENT:  Head: Normocephalic.  Right Ear: External ear normal.  Left Ear: External ear normal.  Mouth/Throat: Oropharynx is clear and moist.  Eyes: Conjunctivae normal and EOM are normal. Pupils are equal, round, and reactive to light.  Neck: Normal range of motion. Neck supple. No thyromegaly present.  Cardiovascular: Normal rate, regular rhythm, normal heart sounds and intact distal pulses.   Pulmonary/Chest: Effort normal and breath sounds normal.  Abdominal: Soft. Bowel sounds are normal. She exhibits no  mass. There is no tenderness.  Musculoskeletal: Normal range of motion.  Lymphadenopathy:    She has no cervical adenopathy.  Neurological: She is alert and oriented to person, place, and time.  Skin: Skin is warm and dry. No rash noted.  Psychiatric: She has a normal mood and affect. Her behavior is normal.          Assessment & Plan:  Hypertension well controlled Exogenous obesity  Medications unchanged and refill Weight loss encouraged Low-salt diet recommended

## 2012-09-27 NOTE — Patient Instructions (Addendum)

## 2012-10-01 ENCOUNTER — Ambulatory Visit: Payer: PRIVATE HEALTH INSURANCE | Admitting: Internal Medicine

## 2012-10-04 ENCOUNTER — Ambulatory Visit: Payer: PRIVATE HEALTH INSURANCE | Admitting: Internal Medicine

## 2012-10-04 DIAGNOSIS — Z23 Encounter for immunization: Secondary | ICD-10-CM

## 2012-10-18 ENCOUNTER — Telehealth: Payer: Self-pay | Admitting: Internal Medicine

## 2012-10-18 NOTE — Telephone Encounter (Signed)
[facility] Triage Call Report Triage Record Num: [id number] Operator: Tomasita Crumble Patient Name: Kayla [name] Call Date & Time: 10/16/2012 11:40:46AM Patient Phone: [phone] PCP: Gordy Savers Patient Gender: Female PCP Fax : [phone] Patient DOB: 1956/09/29 [facility] Name[facility] Reason for Call: Caller: [name]/Patient; PCP: Eleonore Chiquito (Family Practice); CB#: [id number]; Patient Weight: 250 lbs; Call regarding Diarrhea and vomiting, fever; Left 10/07/12 for [place] and consumed egg nog that she thinks made her sick. Unable to eat. Liquid diarrhea. Nausea and abdominal cramps persist, dizzy, lightheaded and dry mouth. . 5 stools in 30 min period this am - green watery liquid. Advised ED per Diarrhea or Other Change in Bowel Habits protocol due to signs of dehydration.[name] states she does not plan to go due to expense. . Protocol(s) Used: Diarrhea or Other Change in Bowel Habits Recommended Outcome per Protocol: See ED Immediately Reason for Outcome: Signs of dehydration Care Advice: ~ 12/

## 2012-11-01 ENCOUNTER — Ambulatory Visit: Payer: PRIVATE HEALTH INSURANCE | Admitting: Internal Medicine

## 2012-11-11 LAB — HM MAMMOGRAPHY: HM Mammogram: NEGATIVE

## 2012-11-17 ENCOUNTER — Encounter: Payer: Self-pay | Admitting: Internal Medicine

## 2013-07-18 ENCOUNTER — Other Ambulatory Visit: Payer: Self-pay | Admitting: Internal Medicine

## 2013-08-20 ENCOUNTER — Ambulatory Visit (INDEPENDENT_AMBULATORY_CARE_PROVIDER_SITE_OTHER): Payer: BC Managed Care – PPO | Admitting: Internal Medicine

## 2013-08-20 ENCOUNTER — Encounter: Payer: Self-pay | Admitting: Internal Medicine

## 2013-08-20 VITALS — BP 110/84 | HR 67 | Temp 98.2°F | Ht 68.0 in

## 2013-08-20 DIAGNOSIS — E669 Obesity, unspecified: Secondary | ICD-10-CM

## 2013-08-20 DIAGNOSIS — R112 Nausea with vomiting, unspecified: Secondary | ICD-10-CM

## 2013-08-20 DIAGNOSIS — E079 Disorder of thyroid, unspecified: Secondary | ICD-10-CM

## 2013-08-20 DIAGNOSIS — I10 Essential (primary) hypertension: Secondary | ICD-10-CM

## 2013-08-20 DIAGNOSIS — K219 Gastro-esophageal reflux disease without esophagitis: Secondary | ICD-10-CM

## 2013-08-20 DIAGNOSIS — Z9884 Bariatric surgery status: Secondary | ICD-10-CM

## 2013-08-20 MED ORDER — PANTOPRAZOLE SODIUM 40 MG PO TBEC: 40.0000 mg | DELAYED_RELEASE_TABLET | Freq: Every day | ORAL | Status: DC

## 2013-08-20 MED ORDER — PROMETHAZINE HCL 25 MG PO TABS: 25.0000 mg | ORAL_TABLET | Freq: Four times a day (QID) | ORAL | Status: DC | PRN

## 2013-08-20 NOTE — Patient Instructions (Addendum)
Add  Another acid medication suppression. Take dialy chronic vomiting is not normal. Take ranitidine 150 mg twice a day in addition to this  If you get fever increasing abd pain  Seek emergent care to the ed.  Otherwise .    Fu with Dr Kirtland Bouchard in the next week.  I think you would benefit from seeing a GI specialist.

## 2013-08-20 NOTE — Progress Notes (Signed)
Chief Complaint  Patient presents with  . Nausea  . Emesis    X 3 days    HPI: Patient comes in today for [facility] Saturday clinic for  new problem evaluation. Onset about 4 days of increasing NV .  Has lap band  And field has drained out.  But has bad "reflux  " noted by dentist with teeth changes   Vomiting and nausea  For weeks and then abd pain.  and to right side . Intermittent without fever uti sx .  Gerd: sleeps upright to avoid acid sx taking zantac bid inc to 150 recnetly  prilosec no help in the past . Says doesn't see gi reg ?   "Doesn't have a gb. " For weeks  days of sx. ertinent positives and negatives per HPI. Had lap band  But didn't help and   Not using but still present  noweight loss. No self induced vomiting  Sees doc [place] ans told she has allergy to gluten etc by blood tests.   Past Medical History  Diagnosis Date  . GERD 11/11/2010  . Hematemesis 11/11/2010  . VASOVAGAL SYNCOPE 11/11/2010  . Obesity   . Diabetes mellitus     Family History  Problem Relation Age of Onset  . Dementia Mother   . Asthma Paternal Grandmother   . Hypertension Mother   . Hypertension Father     History   Social History  . Marital Status: Married    Spouse Name: N/A    Number of Children: N/A  . Years of Education: N/A   Social History Main Topics  . Smoking status: Never Smoker   . Smokeless tobacco: Never Used  . Alcohol Use: No  . Drug Use: No  . Sexual Activity: None   Other Topics Concern  . None   Social History Narrative  . None    Outpatient Encounter Prescriptions as of 08/20/2013  Medication Sig  . acetaminophen (TYLENOL) 500 MG tablet Take 500 mg by mouth every 6 (six) hours as needed.    . fexofenadine (ALLEGRA) 180 MG tablet Take 180 mg by mouth daily.  . furosemide (LASIX) 40 MG tablet Take 1 tablet (40 mg total) by mouth daily as needed. swelling  . lisinopril-hydrochlorothiazide (PRINZIDE,ZESTORETIC) 20-12.5 MG per tablet TAKE 1 TABLET BY MOUTH DAILY.   . Multiple Vitamin (MULTIVITAMIN) tablet Take 1 tablet by mouth daily.    . ranitidine (ZANTAC) 75 MG tablet Take 75 mg by mouth 2 (two) times daily.  . Thyroid 81.25 MG TABS Take 1 tablet by mouth 2 (two) times daily.  . fluticasone (FLONASE) 50 MCG/ACT nasal spray Place 2 sprays into the nose daily.  . pantoprazole (PROTONIX) 40 MG tablet Take 1 tablet (40 mg total) by mouth daily.  . promethazine (PHENERGAN) 25 MG tablet Take 1 tablet (25 mg total) by mouth every 6 (six) hours as needed for nausea.    EXAM:  BP 110/84  Pulse 67  Temp(Src) 98.2 F (36.8 C) (Oral)  Ht 5\' 8"  (1.727 m)  SpO2 96%  Body mass index is 0.00 kg/(m^2).  GENERAL: vitals reviewed and listed above, alert, oriented, appears well hydrated and in no acute distress non toxic  HEENT: atraumatic, conjunctiva  clear, no obvious abnormalities on inspection of external nose and ears OP : no lesion edema or exudate  NECK: no obvious masses on inspection palpation  No adenopathy  LUNGS: clear to auscultation bilaterally, no wheezes, rales or rhonchi, good air movement CV: HRRR, no  clubbing cyanosis or  peripheral edema nl cap refill  MS: moves all extremities without noticeable focal  abnormality Abdomen:  Sof,t normal bowel sounds without hepatosplenomegaly, no guarding rebound or masses no CVA tenderness points to r and left gutter as tender but on distraction NO G OR R  Neg psoas  PSYCH: pleasant and cooperative, no obvious depression or anxiety  ASSESSMENT AND PLAN:  Discussed the following assessment and plan:  Nausea with vomiting - worse recnetly  GERD (gastroesophageal reflux disease)  Hx of bariatric surgery - lap band   Obesity  Unspecified essential hypertension  Thyroid condition - rx by doc in [place]  Pt says she is allergic to gluten etc  From blood testing  Pt has ongoing what sounds like free flowing reflux  And now worse . adivse gi evaluation.  But  Can see dr Kirtland Bouchard PCP  first  For advice   Also  don't see any monitoring labs such as chem lfts in ehr  ? If done by alternative doctor  -Patient advised to return or notify health care team  if symptoms worsen or persist or new concerns arise.  Patient Instructions  Add  Another acid medication suppression. Take dialy chronic vomiting is not normal. Take ranitidine 150 mg twice a day in addition to this  If you get fever increasing abd pain  Seek emergent care to the ed.  Otherwise .    Fu with Dr Kirtland Bouchard in the next week.  I think you would benefit from seeing a GI specialist.     Neta Mends. Panosh M.D.

## 2013-08-23 ENCOUNTER — Telehealth: Payer: Self-pay | Admitting: Internal Medicine

## 2013-08-23 DIAGNOSIS — K219 Gastro-esophageal reflux disease without esophagitis: Secondary | ICD-10-CM

## 2013-08-23 NOTE — Telephone Encounter (Signed)
Left message to call office, referral sent.

## 2013-08-23 NOTE — Telephone Encounter (Signed)
Ok to refer.

## 2013-08-23 NOTE — Telephone Encounter (Signed)
Pt  Seen sat clinic by Dr Fabian Sharp who thinks pt would benefit by seeing GI doc.pt would like referral if you think appropriate.

## 2013-08-23 NOTE — Telephone Encounter (Signed)
Left message on voicemail to call office.  

## 2013-08-24 NOTE — Telephone Encounter (Signed)
Spoke to pt told her order for GI referral was done and someone will be contacting her. Pt verbalized understanding.

## 2013-08-29 ENCOUNTER — Encounter: Payer: Self-pay | Admitting: Gastroenterology

## 2013-09-09 ENCOUNTER — Ambulatory Visit: Payer: PRIVATE HEALTH INSURANCE | Admitting: Internal Medicine

## 2013-09-09 ENCOUNTER — Ambulatory Visit: Payer: PRIVATE HEALTH INSURANCE | Admitting: *Deleted

## 2013-09-12 ENCOUNTER — Ambulatory Visit (INDEPENDENT_AMBULATORY_CARE_PROVIDER_SITE_OTHER): Payer: BC Managed Care – PPO | Admitting: *Deleted

## 2013-09-12 DIAGNOSIS — Z23 Encounter for immunization: Secondary | ICD-10-CM

## 2013-09-19 ENCOUNTER — Other Ambulatory Visit: Payer: Self-pay | Admitting: Internal Medicine

## 2013-09-21 ENCOUNTER — Other Ambulatory Visit (INDEPENDENT_AMBULATORY_CARE_PROVIDER_SITE_OTHER): Payer: BC Managed Care – PPO

## 2013-09-21 DIAGNOSIS — Z0000 Encounter for general adult medical examination without abnormal findings: Secondary | ICD-10-CM

## 2013-09-21 LAB — CBC WITH DIFFERENTIAL/PLATELET
Basophils Absolute: 0 K/uL (ref 0.0–0.1)
Basophils Relative: 0.6 % (ref 0.0–3.0)
Eosinophils Absolute: 0.1 K/uL (ref 0.0–0.7)
Eosinophils Relative: 1.7 % (ref 0.0–5.0)
HCT: 37.2 % (ref 36.0–46.0)
Hemoglobin: 12.4 g/dL (ref 12.0–15.0)
Lymphocytes Relative: 14.8 % (ref 12.0–46.0)
Lymphs Abs: 1.2 K/uL (ref 0.7–4.0)
MCHC: 33.4 g/dL (ref 30.0–36.0)
MCV: 84.1 fl (ref 78.0–100.0)
Monocytes Absolute: 0.6 K/uL (ref 0.1–1.0)
Monocytes Relative: 6.9 % (ref 3.0–12.0)
Neutro Abs: 6.3 K/uL (ref 1.4–7.7)
Neutrophils Relative %: 76 % (ref 43.0–77.0)
Platelets: 279 K/uL (ref 150.0–400.0)
RBC: 4.43 Mil/uL (ref 3.87–5.11)
RDW: 15.6 % — ABNORMAL HIGH (ref 11.5–14.6)
WBC: 8.3 K/uL (ref 4.5–10.5)

## 2013-09-21 LAB — HEPATIC FUNCTION PANEL
ALT: 19 U/L (ref 0–35)
AST: 16 U/L (ref 0–37)
Albumin: 3.6 g/dL (ref 3.5–5.2)
Alkaline Phosphatase: 45 U/L (ref 39–117)
Bilirubin, Direct: 0.2 mg/dL (ref 0.0–0.3)
Total Bilirubin: 1.8 mg/dL — ABNORMAL HIGH (ref 0.3–1.2)
Total Protein: 6.7 g/dL (ref 6.0–8.3)

## 2013-09-21 LAB — BASIC METABOLIC PANEL WITH GFR
BUN: 19 mg/dL (ref 6–23)
CO2: 25 meq/L (ref 19–32)
Calcium: 9.3 mg/dL (ref 8.4–10.5)
Chloride: 106 meq/L (ref 96–112)
Creatinine, Ser: 1 mg/dL (ref 0.4–1.2)
GFR: 58.51 mL/min — ABNORMAL LOW
Glucose, Bld: 88 mg/dL (ref 70–99)
Potassium: 4.4 meq/L (ref 3.5–5.1)
Sodium: 138 meq/L (ref 135–145)

## 2013-09-21 LAB — POCT URINALYSIS DIPSTICK
Blood, UA: NEGATIVE
Glucose, UA: NEGATIVE
Ketones, UA: NEGATIVE
Leukocytes, UA: NEGATIVE
Nitrite, UA: NEGATIVE
Spec Grav, UA: 1.025
Urobilinogen, UA: 0.2
pH, UA: 5.5

## 2013-09-21 LAB — LIPID PANEL
Cholesterol: 145 mg/dL (ref 0–200)
HDL: 48.5 mg/dL (ref >39.00)
LDL Cholesterol: 84 mg/dL (ref 0–99)
Total CHOL/HDL Ratio: 3
Triglycerides: 61 mg/dL (ref 0.0–149.0)
VLDL: 12.2 mg/dL (ref 0.0–40.0)

## 2013-09-21 LAB — TSH: TSH: 1.11 u[IU]/mL (ref 0.35–5.50)

## 2013-09-28 ENCOUNTER — Ambulatory Visit (INDEPENDENT_AMBULATORY_CARE_PROVIDER_SITE_OTHER): Payer: BC Managed Care – PPO | Admitting: Internal Medicine

## 2013-09-28 ENCOUNTER — Encounter: Payer: Self-pay | Admitting: Internal Medicine

## 2013-09-28 VITALS — BP 122/80 | HR 74 | Temp 98.4°F | Resp 20 | Ht 68.25 in | Wt 274.0 lb

## 2013-09-28 DIAGNOSIS — Z0000 Encounter for general adult medical examination without abnormal findings: Secondary | ICD-10-CM

## 2013-09-28 DIAGNOSIS — K219 Gastro-esophageal reflux disease without esophagitis: Secondary | ICD-10-CM

## 2013-09-28 DIAGNOSIS — E669 Obesity, unspecified: Secondary | ICD-10-CM

## 2013-09-28 DIAGNOSIS — I10 Essential (primary) hypertension: Secondary | ICD-10-CM

## 2013-09-28 NOTE — Progress Notes (Signed)
Pre-visit discussion using our clinic review tool. No additional management support is needed unless otherwise documented below in the visit note.  

## 2013-09-28 NOTE — Progress Notes (Signed)
Subjective:    Patient ID: Kayla [name], female    DOB: November 21, 1955, 57 y.o.   MRN: [id number]  HPI Pre-visit discussion using our clinic review tool. No additional management support is needed unless otherwise documented below in the visit note.  57  year old patient who is seen today for a preventive health exam. She has a history of exogenous obesity. She has a history of osteoarthritis and is status post right total knee replacement. She has a history of gastroesophageal reflux disease which has been controlled with H2 blocker therapy in the past for the past 2 years symptoms have been quite severe in spite of an aggressive antireflux regimen and PPI therapy. She is scheduled for GI evaluation in 2 days. She is status post lap band surgery but this is no longer functional. She has no new concerns or complaints.  She has a history of hypertension now on therapy. For the past 2 years there's been an approximate 25 pound weight loss  Alcohol-Tobacco  Smoking Status: never   Allergies:  1) ! Pcn  2) ! Tetracycline  3) ! Morphine  4) ! Ceclor   Past History:  Past Medical History:   Allergies seasonal  exogenous obesity  history of type 2 diabetes   Past Surgical History:  Lap Band surgery October 2008  Hysterectomy- completes 1993  Cholecystectomy- 1995  Right  knee arthroscopic surgery and total replacement  Colonoscopy in November 2009  Family History:   Paternal grandfather had emphesema  Father has had "black lung"  history of dementia. Presently in a nursing home Paternal grandmother had asthma  mother died in age 80, complications of senile dementia  one brother two sisters  one sister had advanced mental retardation and died of complications of cerebral vascular disease   Social History:    Works as a Print production planner  Married with one child  Never smoked  Alcohol on occ.Smoking Status: never    Review of Systems  Constitutional: Negative.   HENT: Negative  for congestion, dental problem, hearing loss, rhinorrhea, sinus pressure, sore throat and tinnitus.   Eyes: Negative for pain, discharge and visual disturbance.  Respiratory: Negative for cough and shortness of breath.   Cardiovascular: Negative for chest pain, palpitations and leg swelling.  Gastrointestinal: Negative for nausea, vomiting, abdominal pain, diarrhea, constipation, blood in stool and abdominal distention.  Genitourinary: Negative for dysuria, urgency, frequency, hematuria, flank pain, vaginal bleeding, vaginal discharge, difficulty urinating, vaginal pain and pelvic pain.  Musculoskeletal: Positive for gait problem and joint swelling. Negative for arthralgias.  Skin: Negative for rash.  Neurological: Negative for dizziness, syncope, speech difficulty, weakness, numbness and headaches.  Hematological: Negative for adenopathy.  Psychiatric/Behavioral: Negative for behavioral problems, dysphoric mood and agitation. The patient is not nervous/anxious.        Objective:   Physical Exam  Constitutional: She is oriented to person, place, and time. She appears well-developed and well-nourished. No distress.  Weight 274  Blood pressure well controlled  HENT:  Head: Normocephalic.  Right Ear: External ear normal.  Left Ear: External ear normal.  Mouth/Throat: Oropharynx is clear and moist.  Eyes: Conjunctivae and EOM are normal. Pupils are equal, round, and reactive to light.  Neck: Normal range of motion. Neck supple. No thyromegaly present.  Cardiovascular: Normal rate, regular rhythm, normal heart sounds and intact distal pulses.   Pulmonary/Chest: Effort normal and breath sounds normal.  Abdominal: Soft. Bowel sounds are normal. She exhibits no mass. There is no tenderness.  Musculoskeletal: Normal range of motion.  Lymphadenopathy:    She has no cervical adenopathy.  Neurological: She is alert and oriented to person, place, and time.  Skin: Skin is warm and dry. No rash  noted.  Psychiatric: She has a normal mood and affect. Her behavior is normal.          Assessment & Plan:  Preventive health examination Right knee osteoarthritis. Status post right total knee replacement Exogenous obesity; history of failed lap  band surgery Refractory GERD. Patient has GI evaluation in 2 days. We'll continue aggressive antireflux regimen

## 2013-09-28 NOTE — Patient Instructions (Signed)
Limit your sodium (Salt) intake    It is important that you exercise regularly, at least 20 minutes 3 to 4 times per week.  If you develop chest pain or shortness of breath seek  medical attention.  You need to lose weight.  Consider a lower calorie diet and regular exercise.  Avoids foods high in acid such as tomatoes citrus juices, and spicy foods.  Avoid eating within two hours of lying down or before exercising.  Do not overheat.  Try smaller more frequent meals.  If symptoms persist, elevate the head of her bed 12 inches while sleeping.  Return in one year for follow-up  GI followup as scheduled

## 2013-09-30 ENCOUNTER — Encounter: Payer: Self-pay | Admitting: Gastroenterology

## 2013-09-30 ENCOUNTER — Ambulatory Visit (INDEPENDENT_AMBULATORY_CARE_PROVIDER_SITE_OTHER): Payer: BC Managed Care – PPO | Admitting: Gastroenterology

## 2013-09-30 VITALS — BP 104/78 | HR 76 | Ht 68.0 in | Wt 281.2 lb

## 2013-09-30 DIAGNOSIS — R112 Nausea with vomiting, unspecified: Secondary | ICD-10-CM

## 2013-09-30 DIAGNOSIS — R1310 Dysphagia, unspecified: Secondary | ICD-10-CM

## 2013-09-30 DIAGNOSIS — K219 Gastro-esophageal reflux disease without esophagitis: Secondary | ICD-10-CM

## 2013-09-30 DIAGNOSIS — K920 Hematemesis: Secondary | ICD-10-CM

## 2013-09-30 NOTE — Progress Notes (Signed)
_                                                                                                                History of Present Illness: 57 year old white female referred for evaluation of dysphagia and vomiting.  She underwent lap band procedure several years ago for obesity.  The band had to be adjusted several times since it caused obstruction of her esophagus.  Ultimately the band was entirely deflated.  She complains of dysphagia to solids.  In addition, she has very frequent regurgitation of gastric contents, sometimes within minutes of eating and sometimes within hours.  She has multiple episodes of vomiting throughout the day and night.  She complains of abdominal bloating and intermittent nausea.  After  take down of the lap band she gained weight.  She has lost about 40 pounds over the past couple of years through dieting but finds it difficult to lose weight despite the vomiting.  She denies pyrosis, per se.  [name] had teeth erosions from frequent vomiting.  With vomiting she has seen small amounts of blood.  She is maintained on Protonix and ranitidine.  The patient underwent colonoscopy several years ago but the procedure note is not available.  She believes it was normal.   Past Medical History  Diagnosis Date  . GERD 11/11/2010  . Hematemesis 11/11/2010  . VASOVAGAL SYNCOPE 11/11/2010  . Obesity   . Diabetes mellitus    Past Surgical History  Procedure Laterality Date  . Abdominal hysterectomy    . Cholecystectomy    . Laparoscopic gastric banding    . Knee arthroscopy Left   . Total knee arthroplasty Right 10 2012    right  bean   family history includes Alzheimer's disease in her mother; Asthma in her paternal grandmother; Dementia in her father; Hypertension in her father and mother; Other in her father. Current Outpatient Prescriptions  Medication Sig Dispense Refill  . acetaminophen (TYLENOL) 500 MG tablet Take 500 mg by mouth every 6 (six) hours as  needed.        . fexofenadine (ALLEGRA) 180 MG tablet Take 180 mg by mouth daily.      . fluticasone (FLONASE) 50 MCG/ACT nasal spray Place 2 sprays into the nose daily as needed.      Marland Kitchen lisinopril-hydrochlorothiazide (PRINZIDE,ZESTORETIC) 20-12.5 MG per tablet TAKE 1 TABLET BY MOUTH DAILY.  90 tablet  0  . Multiple Vitamin (MULTIVITAMIN) tablet Take 1 tablet by mouth daily.        . pantoprazole (PROTONIX) 40 MG tablet TAKE 1 TABLET (40 MG TOTAL) BY MOUTH DAILY.  30 tablet  5  . ranitidine (ZANTAC) 75 MG tablet Take 75 mg by mouth 2 (two) times daily.      . Thyroid 81.25 MG TABS Take 1 tablet by mouth 2 (two) times daily.       No current facility-administered medications for this visit.   Allergies as of 09/30/2013 - Review Complete 09/30/2013  Allergen Reaction Noted  . Dairy  aid [lactase] Other (See Comments) 09/28/2013  . Eggs or egg-derived products Diarrhea 09/28/2013  . Gluten meal Diarrhea 09/28/2013  . Asa arthritis strength-antacid [aspirin buffered]  08/18/2011  . Cefaclor    . Morphine    . Penicillins    . Tetracycline      reports that she has never smoked. She has never used smokeless tobacco. She reports that she does not drink alcohol or use illicit drugs.     Review of Systems: Pertinent positive and negative review of systems were noted in the above HPI section. All other review of systems were otherwise negative.  Vital signs were reviewed in today's medical record Physical Exam: General: Well developed , well nourished, no acute distress Skin: anicteric Head: Normocephalic and atraumatic Eyes:  sclerae anicteric, EOMI Ears: Normal auditory acuity Mouth: No deformity or lesions Neck: Supple, no masses or thyromegaly Lungs: Clear throughout to auscultation Heart: Regular rate and rhythm; no murmurs, rubs or bruits Abdomen: Soft,  and non distended. No masses, hepatosplenomegaly or hernias noted. Normal Bowel sounds.  There is mild tenderness in the right  periumbilical area.  There is no succussion splash. Rectal:deferred Musculoskeletal: Symmetrical with no gross deformities  Skin: No lesions on visible extremities Pulses:  Normal pulses noted Extremities: No clubbing, cyanosis, edema or deformities noted Neurological: Alert oriented x 4, grossly nonfocal Cervical Nodes:  No significant cervical adenopathy Inguinal Nodes: No significant inguinal adenopathy Psychological:  Alert and cooperative. Normal mood and affect

## 2013-09-30 NOTE — Assessment & Plan Note (Signed)
Partial gastric outlet obstruction and gastroparesis are considerations.  Recommendations #1 upper GI series #2 gastric emptying scan if #1 is negative

## 2013-09-30 NOTE — Patient Instructions (Signed)
You have been scheduled for an Upper GI Series at [facility]. Your appointment is on 10/03/2013 at 10am. Please arrive 15 minutes prior to your test for registration. Make sure not to eat or drink anything after midnight on the night before your test. If you need to reschedule, please call radiology at [phone]. ________________________________________________________________ An upper GI series uses x rays to help diagnose problems of the upper GI tract, which includes the esophagus, stomach, and duodenum. The duodenum is the first part of the small intestine. An upper GI series is conducted by a radiology technologist or a radiologist-a doctor who specializes in x-ray imaging-at a hospital or outpatient center. While sitting or standing in front of an x-ray machine, the patient drinks barium liquid, which is often white and has a chalky consistency and taste. The barium liquid coats the lining of the upper GI tract and makes signs of disease show up more clearly on x rays. X-ray video, called fluoroscopy, is used to view the barium liquid moving through the esophagus, stomach, and duodenum. Additional x rays and fluoroscopy are performed while the patient lies on an x-ray table. To fully coat the upper GI tract with barium liquid, the technologist or radiologist may press on the abdomen or ask the patient to change position. Patients hold still in various positions, allowing the technologist or radiologist to take x rays of the upper GI tract at different angles. If a technologist conducts the upper GI series, a radiologist will later examine the images to look for problems.  This test typically takes about 1 hour to complete. __________________________________________________________________

## 2013-09-30 NOTE — Assessment & Plan Note (Signed)
Dysphagia is probably 2 to esophageal stricture or extrinsic compression related to the band.  Recommendations #1 upper GI series to delineate anatomy #2 upper endoscopy with dilatation therapy pending #1

## 2013-09-30 NOTE — Assessment & Plan Note (Signed)
Patient has had limited hematemesis associated with her multiple episodes of regurgitation of vomiting.  I suspect that she has erosions in the esophagus and perhaps the stomach.  Recommendations #1 upper endoscopy #2 continue PPI therapy and ranitidine

## 2013-10-03 ENCOUNTER — Telehealth: Payer: Self-pay

## 2013-10-03 ENCOUNTER — Ambulatory Visit (HOSPITAL_COMMUNITY)
Admission: RE | Admit: 2013-10-03 | Discharge: 2013-10-03 | Disposition: A | Discharge status: Home / Self Care | Payer: BC Managed Care – PPO | Source: Ambulatory Visit | Attending: Gastroenterology | Admitting: Gastroenterology

## 2013-10-03 ENCOUNTER — Ambulatory Visit (HOSPITAL_COMMUNITY): Admit: 2013-10-03 | Payer: Self-pay | Admitting: Gastroenterology

## 2013-10-03 ENCOUNTER — Telehealth (INDEPENDENT_AMBULATORY_CARE_PROVIDER_SITE_OTHER): Payer: Self-pay

## 2013-10-03 ENCOUNTER — Other Ambulatory Visit: Payer: Self-pay

## 2013-10-03 ENCOUNTER — Telehealth (INDEPENDENT_AMBULATORY_CARE_PROVIDER_SITE_OTHER): Payer: Self-pay | Admitting: Surgery

## 2013-10-03 ENCOUNTER — Other Ambulatory Visit: Payer: Self-pay | Admitting: Gastroenterology

## 2013-10-03 ENCOUNTER — Encounter (HOSPITAL_COMMUNITY): Payer: Self-pay

## 2013-10-03 DIAGNOSIS — K222 Esophageal obstruction: Secondary | ICD-10-CM | POA: Insufficient documentation

## 2013-10-03 DIAGNOSIS — K9509 Other complications of gastric band procedure: Secondary | ICD-10-CM | POA: Insufficient documentation

## 2013-10-03 DIAGNOSIS — R1319 Other dysphagia: Secondary | ICD-10-CM

## 2013-10-03 DIAGNOSIS — K219 Gastro-esophageal reflux disease without esophagitis: Secondary | ICD-10-CM

## 2013-10-03 DIAGNOSIS — Z9884 Bariatric surgery status: Secondary | ICD-10-CM | POA: Insufficient documentation

## 2013-10-03 DIAGNOSIS — R1310 Dysphagia, unspecified: Secondary | ICD-10-CM | POA: Insufficient documentation

## 2013-10-03 SURGERY — EGD (ESOPHAGOGASTRODUODENOSCOPY)
Anesthesia: Moderate Sedation

## 2013-10-03 NOTE — Telephone Encounter (Signed)
Pt had barium swallow today, barely any barium would go through. Dr. Arlyce Dice spoke with Dr. Constance Goltz and Dr. Johna Sheriff. Per Dr. Johna Sheriff the pt needs to have her lap band taken down. Dr. Arlyce Dice will notify pt. Note sent to Dr. Johna Sheriff.

## 2013-10-03 NOTE — Telephone Encounter (Signed)
Dr. Darrel Hoover called me about Kayla [name] and problems with her lap band.  She had a lap band placed by Dr. Johna Sheriff 07/26/2007.  She was last seen by Dr. Johna Sheriff around 2010.  She had an UGI 06/28/2009 - but never heard the results of that test.  She has, over the last year, had increasing dysphagia.  The trouble swallowing has become more acute over the last 3 months.  During this time, she has not tried to contact our office.  She saw Dr. Arlyce Dice who obtained an UGI 10/03/2013 which showed a probable slip.  The patient does not want to see Dr. Johna Sheriff.  I discussed with the patient by phone about seeing Dr. Daphine Deutscher, 10/04/2013, to make sure there is no fluid in the lap band.  If there is no fluid or she just wants the lap band removed, I could do this later this week at [facility].  Kayla [name] will call the patient about the appointment.  Kayla Kin, MD, FACS

## 2013-10-03 NOTE — Telephone Encounter (Signed)
Dr Arlyce Dice is wanting to refer the pt to Dr Ezzard Standing asap.  She came to him for reflux and had a barium swallow.  Pt has a lap band.  The swallow showed no barium going through the lap band.  I am unsure who did her lap band surgery but she has seen Dr Johna Sheriff before.  Bonita Quin gave me Dr Marzetta Board # to have Dr Ezzard Standing call and discuss with him over the phone.  Cell [phone].  I will send him a message.

## 2013-10-03 NOTE — Telephone Encounter (Signed)
Per Dr. Arlyce Dice he spoke with pt and she states she does not want to see Dr. Johna Sheriff again. Dr. Arlyce Dice requests pt be seen by Dr. Ezzard Standing asap. Spoke with Dr. Allene Pyo nurse and she was given the phone number for Dr. Arlyce Dice so he can call and discuss the pt with him.

## 2013-10-04 ENCOUNTER — Encounter (HOSPITAL_COMMUNITY): Payer: Self-pay | Admitting: *Deleted

## 2013-10-04 ENCOUNTER — Encounter (INDEPENDENT_AMBULATORY_CARE_PROVIDER_SITE_OTHER): Payer: Self-pay | Admitting: Surgery

## 2013-10-04 ENCOUNTER — Inpatient Hospital Stay (HOSPITAL_COMMUNITY)
Admission: AD | Admit: 2013-10-04 | Discharge: 2013-10-06 | DRG: 988 | Disposition: A | Discharge status: Home / Self Care | Payer: BC Managed Care – PPO | Source: Ambulatory Visit | Attending: General Surgery | Admitting: General Surgery

## 2013-10-04 ENCOUNTER — Ambulatory Visit (INDEPENDENT_AMBULATORY_CARE_PROVIDER_SITE_OTHER): Payer: BC Managed Care – PPO | Admitting: Surgery

## 2013-10-04 VITALS — BP 126/84 | HR 76 | Resp 16 | Ht 71.0 in | Wt 277.4 lb

## 2013-10-04 DIAGNOSIS — E669 Obesity, unspecified: Secondary | ICD-10-CM | POA: Diagnosis present

## 2013-10-04 DIAGNOSIS — E860 Dehydration: Secondary | ICD-10-CM | POA: Diagnosis present

## 2013-10-04 DIAGNOSIS — K9509 Other complications of gastric band procedure: Principal | ICD-10-CM | POA: Diagnosis present

## 2013-10-04 DIAGNOSIS — Z9884 Bariatric surgery status: Secondary | ICD-10-CM

## 2013-10-04 DIAGNOSIS — Z6841 Body Mass Index (BMI) 40.0 and over, adult: Secondary | ICD-10-CM

## 2013-10-04 DIAGNOSIS — Z6838 Body mass index (BMI) 38.0-38.9, adult: Secondary | ICD-10-CM

## 2013-10-04 DIAGNOSIS — Y838 Other surgical procedures as the cause of abnormal reaction of the patient, or of later complication, without mention of misadventure at the time of the procedure: Secondary | ICD-10-CM | POA: Diagnosis present

## 2013-10-04 DIAGNOSIS — K2100 Gastro-esophageal reflux disease with esophagitis, without bleeding: Secondary | ICD-10-CM

## 2013-10-04 LAB — CBC
HCT: 35.3 % — ABNORMAL LOW (ref 36.0–46.0)
Hemoglobin: 11.9 g/dL — ABNORMAL LOW (ref 12.0–15.0)
MCH: 28.3 pg (ref 26.0–34.0)
MCHC: 33.7 g/dL (ref 30.0–36.0)
MCV: 84 fL (ref 78.0–100.0)
Platelets: 252 10*3/uL (ref 150–400)
RBC: 4.2 MIL/uL (ref 3.87–5.11)
RDW: 15.4 % (ref 11.5–15.5)
WBC: 6.2 10*3/uL (ref 4.0–10.5)

## 2013-10-04 LAB — CREATININE, SERUM
Creatinine, Ser: 0.93 mg/dL (ref 0.50–1.10)
GFR calc Af Amer: 78 mL/min — ABNORMAL LOW (ref >90)
GFR calc non Af Amer: 67 mL/min — ABNORMAL LOW (ref >90)

## 2013-10-04 MED ORDER — KCL IN DEXTROSE-NACL 20-5-0.45 MEQ/L-%-% IV SOLN
INTRAVENOUS | Status: DC
  Administered 2013-10-04 – 2013-10-05 (×3): via INTRAVENOUS
  Filled 2013-10-04 (×7): qty 1000

## 2013-10-04 MED ORDER — ONDANSETRON HCL 4 MG PO TABS: 4.0000 mg | ORAL_TABLET | Freq: Four times a day (QID) | ORAL | Status: DC | PRN

## 2013-10-04 MED ORDER — ONDANSETRON HCL 4 MG/2ML IJ SOLN
4.0000 mg | Freq: Four times a day (QID) | INTRAMUSCULAR | Status: DC | PRN
  Administered 2013-10-05: 4 mg via INTRAVENOUS
  Filled 2013-10-04 (×2): qty 2

## 2013-10-04 MED ORDER — ACETAMINOPHEN 325 MG PO TABS: 650.0000 mg | ORAL_TABLET | Freq: Four times a day (QID) | ORAL | Status: DC | PRN

## 2013-10-04 MED ORDER — FOLIC ACID 5 MG/ML IJ SOLN
1.0000 mg | Freq: Every day | INTRAMUSCULAR | Status: DC
  Administered 2013-10-04 – 2013-10-05 (×2): 1 mg via INTRAVENOUS
  Filled 2013-10-04 (×4): qty 0.2

## 2013-10-04 MED ORDER — ACETAMINOPHEN 650 MG RE SUPP: 650.0000 mg | Freq: Four times a day (QID) | RECTAL | Status: DC | PRN

## 2013-10-04 MED ORDER — HEPARIN SODIUM (PORCINE) 5000 UNIT/ML IJ SOLN
5000.0000 [IU] | Freq: Three times a day (TID) | INTRAMUSCULAR | Status: DC
  Administered 2013-10-04: 5000 [IU] via SUBCUTANEOUS
  Filled 2013-10-04 (×5): qty 1

## 2013-10-04 MED ORDER — THIAMINE HCL 100 MG/ML IJ SOLN
100.0000 mg | Freq: Every day | INTRAMUSCULAR | Status: DC
  Administered 2013-10-04 – 2013-10-06 (×3): 100 mg via INTRAVENOUS
  Filled 2013-10-04 (×4): qty 1

## 2013-10-04 NOTE — Progress Notes (Signed)
Chief Complaint:  Slipped Lapband APS  History of Present Illness:  Kayla [name] is an 57 y.o. female who had a Lapband APS placed by Dr. Johna Sheriff in October 2008.  She did well for a year and developed GERD symptoms.  She was lost to followup by our office until she developed near complete obstruction and on UGI yesterday showed the slip and obstruction.  She is dry and unable to keep anything down and hence is admitted emergently for rehydration and removal of her lapband.     Past Medical History  Diagnosis Date  . GERD 11/11/2010  . Hematemesis 11/11/2010  . VASOVAGAL SYNCOPE 11/11/2010  . Obesity   . Diabetes mellitus     Past Surgical History  Procedure Laterality Date  . Abdominal hysterectomy    . Cholecystectomy    . Laparoscopic gastric banding    . Knee arthroscopy Left   . Total knee arthroplasty Right 10 2012    right  bean    Current Outpatient Prescriptions  Medication Sig Dispense Refill  . acetaminophen (TYLENOL) 500 MG tablet Take 500 mg by mouth every 6 (six) hours as needed.        . fexofenadine (ALLEGRA) 180 MG tablet Take 180 mg by mouth daily.      Marland Kitchen lisinopril-hydrochlorothiazide (PRINZIDE,ZESTORETIC) 20-12.5 MG per tablet TAKE 1 TABLET BY MOUTH DAILY.  90 tablet  0  . montelukast (SINGULAIR) 10 MG tablet Take 10 mg by mouth at bedtime.      . Multiple Vitamin (MULTIVITAMIN) tablet Take 1 tablet by mouth daily.        . pantoprazole (PROTONIX) 40 MG tablet TAKE 1 TABLET (40 MG TOTAL) BY MOUTH DAILY.  30 tablet  5  . ranitidine (ZANTAC) 75 MG tablet Take 75 mg by mouth 2 (two) times daily.      . Thyroid 81.25 MG TABS Take 1 tablet by mouth 2 (two) times daily.      . fluticasone (FLONASE) 50 MCG/ACT nasal spray Place 2 sprays into the nose daily as needed.       No current facility-administered medications for this visit.   Dairy aid; Eggs or egg-derived products; Gluten meal; Asa arthritis strength-antacid; Cefaclor; Morphine; Penicillins; and  Tetracycline Family History  Problem Relation Age of Onset  . Alzheimer's disease Mother   . Asthma Paternal Grandmother   . Hypertension Mother   . Hypertension Father   . Dementia Father   . Other Father     stomcah surgery with ostomy bag   Social History:   reports that she has never smoked. She has never used smokeless tobacco. She reports that she does not drink alcohol or use illicit drugs.   REVIEW OF SYSTEMS - PERTINENT POSITIVES ONLY: See old chart  Physical Exam:   Blood pressure 126/84, pulse 76, resp. rate 16, height 5\' 11"  (1.803 m), weight 277 lb 6.4 oz (125.828 kg). Body mass index is 38.71 kg/(m^2).  Gen:  WDWN WF NAD  Neurological: Alert and oriented to person, place, and time. Motor and sensory function is grossly intact  Head: Normocephalic and atraumatic.  Eyes: Conjunctivae are normal. Pupils are equal, round, and reactive to light. No scleral icterus.  Neck: Normal range of motion. Neck supple. No tracheal deviation or thyromegaly present.  Cardiovascular:  SR without murmurs or gallops.  No carotid bruits Respiratory: Effort normal.  No respiratory distress. No chest wall tenderness. Breath sounds normal.  No wheezes, rales or rhonchi.  Abdomen:  Port  accessed in the office and additional 1 cc of fluid was removed.   GU: Musculoskeletal: Normal range of motion. Extremities are nontender. No cyanosis, edema or clubbing noted Lymphadenopathy: No cervical, preauricular, postauricular or axillary adenopathy is present Skin: Skin is warm and dry. No rash noted. No diaphoresis. No erythema. No pallor. Pscyh: Normal mood and affect. Behavior is normal. Judgment and thought content normal.   LABORATORY RESULTS: No results found for this or any previous visit (from the past 48 hour(s)).  RADIOLOGY RESULTS: Dg Esophagus  10/03/2013   CLINICAL DATA:  Post remote lap band placement. Difficulty swallowing  EXAM: ESOPHOGRAM/BARIUM SWALLOW  TECHNIQUE: Single contrast  examination was performed using  thin barium.  COMPARISON:  06/28/2009  FLUOROSCOPY TIME:  One minute and 17 seconds.  FINDINGS: On the scout view, lap band is in place. When the patient is placed in upright position, the lap band is directed between the 2 and 8 o'clock position.  With the patient in an upright position, small amount of thin barium was ingested. There was significant holdup of forward flow at the level of the lap band. With delayed imaging, only a minimal amount of the ingested contrast traversed beyond the lap band where significant narrowing is noted. The study was terminated at this point.  IMPRESSION: Marked narrowing at the level of the lap band with significant decreased forward flow of ingested barium. Study was terminated at this point. Please see above.  These results were called by telephone at the time of interpretation on 10/03/2013 at 11:05 AM to Dr. Melvia Heaps 's nurse Bonita Quin, who verbally acknowledged these results.   Electronically Signed   By: Bridgett Larsson M.D.   On: 10/03/2013 11:28    Problem List: Patient Active Problem List   Diagnosis Date Noted  . Lapband APS October 2008 10/04/2013  . Dysphagia, unspecified(787.20) 09/30/2013  . Allergic rhinitis 06/14/2012  . Post-operative state 08/20/2011  . Obesity 08/20/2011  . High blood pressure 08/18/2011  . GERD 11/11/2010  . VASOVAGAL SYNCOPE 11/11/2010    Assessment & Plan: Admit and rehydrate.  I discussed resiting or replicating of the band.  She would like it removed.       Matt B. Daphine Deutscher, MD, [facility] [phone] beeper [phone]  10/04/2013 5:47 PM

## 2013-10-05 ENCOUNTER — Inpatient Hospital Stay (HOSPITAL_COMMUNITY): Payer: BC Managed Care – PPO | Admitting: Certified Registered Nurse Anesthetist

## 2013-10-05 ENCOUNTER — Encounter (HOSPITAL_COMMUNITY): Admission: AD | Disposition: A | Discharge status: Home / Self Care | Payer: Self-pay | Source: Ambulatory Visit

## 2013-10-05 ENCOUNTER — Encounter (HOSPITAL_COMMUNITY): Payer: Self-pay | Admitting: Certified Registered Nurse Anesthetist

## 2013-10-05 ENCOUNTER — Encounter (HOSPITAL_COMMUNITY): Payer: BC Managed Care – PPO | Admitting: Certified Registered Nurse Anesthetist

## 2013-10-05 DIAGNOSIS — T85898A Other specified complication of other internal prosthetic devices, implants and grafts, initial encounter: Secondary | ICD-10-CM

## 2013-10-05 HISTORY — PX: LAPAROSCOPIC REPAIR AND REMOVAL OF GASTRIC BAND: SHX5919

## 2013-10-05 HISTORY — PX: UPPER GI ENDOSCOPY: SHX6162

## 2013-10-05 LAB — MRSA PCR SCREENING: MRSA by PCR: NEGATIVE

## 2013-10-05 SURGERY — LAPAROSCOPIC REPAIR AND REMOVAL OF GASTRIC BAND
Anesthesia: General

## 2013-10-05 MED ORDER — HYDROCODONE-ACETAMINOPHEN 5-325 MG PO TABS: 1.0000 | ORAL_TABLET | ORAL | Status: DC | PRN

## 2013-10-05 MED ORDER — HYDROMORPHONE HCL PF 2 MG/ML IJ SOLN
INTRAMUSCULAR | Status: AC
  Filled 2013-10-05: qty 1

## 2013-10-05 MED ORDER — MIDAZOLAM HCL 2 MG/2ML IJ SOLN
INTRAMUSCULAR | Status: AC
  Filled 2013-10-05: qty 2

## 2013-10-05 MED ORDER — LISINOPRIL 20 MG PO TABS
20.0000 mg | ORAL_TABLET | Freq: Every day | ORAL | Status: DC
  Administered 2013-10-06: 20 mg via ORAL
  Filled 2013-10-05: qty 1

## 2013-10-05 MED ORDER — CEFAZOLIN SODIUM-DEXTROSE 2-3 GM-% IV SOLR
INTRAVENOUS | Status: AC
  Filled 2013-10-05: qty 50

## 2013-10-05 MED ORDER — BUPIVACAINE-EPINEPHRINE 0.25% -1:200000 IJ SOLN
INTRAMUSCULAR | Status: DC | PRN
  Administered 2013-10-05: 35 mL

## 2013-10-05 MED ORDER — ROCURONIUM BROMIDE 100 MG/10ML IV SOLN
INTRAVENOUS | Status: AC
  Filled 2013-10-05: qty 1

## 2013-10-05 MED ORDER — GLYCOPYRROLATE 0.2 MG/ML IJ SOLN
INTRAMUSCULAR | Status: AC
  Filled 2013-10-05: qty 3

## 2013-10-05 MED ORDER — FAMOTIDINE 20 MG PO TABS
20.0000 mg | ORAL_TABLET | Freq: Two times a day (BID) | ORAL | Status: DC
  Administered 2013-10-05 – 2013-10-06 (×2): 20 mg via ORAL
  Filled 2013-10-05 (×3): qty 1

## 2013-10-05 MED ORDER — NEOSTIGMINE METHYLSULFATE 1 MG/ML IJ SOLN
INTRAMUSCULAR | Status: AC
  Filled 2013-10-05: qty 10

## 2013-10-05 MED ORDER — OXYCODONE HCL 5 MG/5ML PO SOLN: 5.0000 mg | Freq: Once | ORAL | Status: DC | PRN

## 2013-10-05 MED ORDER — LORATADINE 10 MG PO TABS
10.0000 mg | ORAL_TABLET | Freq: Every day | ORAL | Status: DC | PRN
  Filled 2013-10-05: qty 1

## 2013-10-05 MED ORDER — HYDROMORPHONE HCL PF 1 MG/ML IJ SOLN: 0.2500 mg | INTRAMUSCULAR | Status: DC | PRN

## 2013-10-05 MED ORDER — MIDAZOLAM HCL 5 MG/5ML IJ SOLN
INTRAMUSCULAR | Status: DC | PRN
  Administered 2013-10-05: 2 mg via INTRAVENOUS

## 2013-10-05 MED ORDER — FENTANYL CITRATE 0.05 MG/ML IJ SOLN
INTRAMUSCULAR | Status: AC
  Filled 2013-10-05: qty 5

## 2013-10-05 MED ORDER — PROPOFOL 10 MG/ML IV BOLUS
INTRAVENOUS | Status: DC | PRN
  Administered 2013-10-05: 160 mg via INTRAVENOUS

## 2013-10-05 MED ORDER — LIDOCAINE HCL (CARDIAC) 20 MG/ML IV SOLN
INTRAVENOUS | Status: DC | PRN
  Administered 2013-10-05: 100 mg via INTRAVENOUS

## 2013-10-05 MED ORDER — HEPARIN SODIUM (PORCINE) 5000 UNIT/ML IJ SOLN
5000.0000 [IU] | Freq: Three times a day (TID) | INTRAMUSCULAR | Status: DC
  Administered 2013-10-05 – 2013-10-06 (×2): 5000 [IU] via SUBCUTANEOUS
  Filled 2013-10-05 (×5): qty 1

## 2013-10-05 MED ORDER — PROGESTERONE MICRONIZED 200 MG PO CAPS
200.0000 mg | ORAL_CAPSULE | Freq: Every day | ORAL | Status: DC
  Administered 2013-10-05: 200 mg via ORAL
  Filled 2013-10-05 (×2): qty 1

## 2013-10-05 MED ORDER — CISATRACURIUM BESYLATE (PF) 10 MG/5ML IV SOLN
INTRAVENOUS | Status: DC | PRN
  Administered 2013-10-05: 5 mg via INTRAVENOUS

## 2013-10-05 MED ORDER — MEPERIDINE HCL 25 MG/ML IJ SOLN: 6.2500 mg | INTRAMUSCULAR | Status: DC | PRN

## 2013-10-05 MED ORDER — PROMETHAZINE HCL 25 MG/ML IJ SOLN: 6.2500 mg | INTRAMUSCULAR | Status: DC | PRN

## 2013-10-05 MED ORDER — ONDANSETRON HCL 4 MG/2ML IJ SOLN
INTRAMUSCULAR | Status: AC
  Filled 2013-10-05: qty 2

## 2013-10-05 MED ORDER — HYDROCHLOROTHIAZIDE 12.5 MG PO CAPS
12.5000 mg | ORAL_CAPSULE | Freq: Every day | ORAL | Status: DC
  Administered 2013-10-06: 12.5 mg via ORAL
  Filled 2013-10-05: qty 1

## 2013-10-05 MED ORDER — SUCCINYLCHOLINE CHLORIDE 20 MG/ML IJ SOLN
INTRAMUSCULAR | Status: DC | PRN
  Administered 2013-10-05: 100 mg via INTRAVENOUS

## 2013-10-05 MED ORDER — PROPOFOL 10 MG/ML IV BOLUS
INTRAVENOUS | Status: AC
  Filled 2013-10-05: qty 20

## 2013-10-05 MED ORDER — PANTOPRAZOLE SODIUM 40 MG PO TBEC
40.0000 mg | DELAYED_RELEASE_TABLET | Freq: Every morning | ORAL | Status: DC
Start: 2013-10-06 — End: 2013-10-06
  Administered 2013-10-06: 40 mg via ORAL
  Filled 2013-10-05: qty 1

## 2013-10-05 MED ORDER — HYDROMORPHONE HCL PF 1 MG/ML IJ SOLN
0.5000 mg | INTRAMUSCULAR | Status: DC | PRN
  Administered 2013-10-05: 1 mg via INTRAVENOUS
  Administered 2013-10-05 – 2013-10-06 (×2): 2 mg via INTRAVENOUS
  Filled 2013-10-05: qty 1
  Filled 2013-10-05 (×2): qty 2

## 2013-10-05 MED ORDER — BUPIVACAINE-EPINEPHRINE 0.25% -1:200000 IJ SOLN
INTRAMUSCULAR | Status: AC
  Filled 2013-10-05: qty 1

## 2013-10-05 MED ORDER — DEXAMETHASONE SODIUM PHOSPHATE 10 MG/ML IJ SOLN
INTRAMUSCULAR | Status: DC | PRN
  Administered 2013-10-05: 10 mg via INTRAVENOUS

## 2013-10-05 MED ORDER — NEOSTIGMINE METHYLSULFATE 1 MG/ML IJ SOLN
INTRAMUSCULAR | Status: DC | PRN
  Administered 2013-10-05: 4 mg via INTRAVENOUS

## 2013-10-05 MED ORDER — LIDOCAINE HCL (CARDIAC) 20 MG/ML IV SOLN
INTRAVENOUS | Status: AC
  Filled 2013-10-05: qty 5

## 2013-10-05 MED ORDER — DEXTROSE 5 % IV SOLN
3.0000 g | INTRAVENOUS | Status: AC
  Administered 2013-10-05: 3 g via INTRAVENOUS
  Filled 2013-10-05: qty 3000

## 2013-10-05 MED ORDER — 0.9 % SODIUM CHLORIDE (POUR BTL) OPTIME
TOPICAL | Status: DC | PRN
  Administered 2013-10-05: 1000 mL

## 2013-10-05 MED ORDER — DEXAMETHASONE SODIUM PHOSPHATE 10 MG/ML IJ SOLN
INTRAMUSCULAR | Status: AC
  Filled 2013-10-05: qty 1

## 2013-10-05 MED ORDER — LISINOPRIL-HYDROCHLOROTHIAZIDE 20-12.5 MG PO TABS: 1.0000 | ORAL_TABLET | Freq: Every morning | ORAL | Status: DC

## 2013-10-05 MED ORDER — HYDROMORPHONE HCL PF 1 MG/ML IJ SOLN
INTRAMUSCULAR | Status: DC | PRN
  Administered 2013-10-05: 0.5 mg via INTRAVENOUS
  Administered 2013-10-05: 1 mg via INTRAVENOUS
  Administered 2013-10-05: 0.5 mg via INTRAVENOUS

## 2013-10-05 MED ORDER — OXYCODONE HCL 5 MG PO TABS: 5.0000 mg | ORAL_TABLET | Freq: Once | ORAL | Status: DC | PRN

## 2013-10-05 MED ORDER — SUCCINYLCHOLINE CHLORIDE 20 MG/ML IJ SOLN
INTRAMUSCULAR | Status: AC
  Filled 2013-10-05: qty 1

## 2013-10-05 MED ORDER — CEFAZOLIN SODIUM 1-5 GM-% IV SOLN
1.0000 g | Freq: Once | INTRAVENOUS | Status: DC
  Filled 2013-10-05: qty 50

## 2013-10-05 MED ORDER — THYROID 81.25 MG PO TABS
1.0000 | ORAL_TABLET | Freq: Two times a day (BID) | ORAL | Status: DC
  Filled 2013-10-05: qty 1

## 2013-10-05 MED ORDER — FENTANYL CITRATE 0.05 MG/ML IJ SOLN
INTRAMUSCULAR | Status: DC | PRN
  Administered 2013-10-05 (×2): 50 ug via INTRAVENOUS
  Administered 2013-10-05: 150 ug via INTRAVENOUS

## 2013-10-05 MED ORDER — MONTELUKAST SODIUM 10 MG PO TABS
10.0000 mg | ORAL_TABLET | Freq: Every day | ORAL | Status: DC
  Administered 2013-10-05: 10 mg via ORAL
  Filled 2013-10-05 (×2): qty 1

## 2013-10-05 MED ORDER — LACTATED RINGERS IV SOLN
INTRAVENOUS | Status: DC
  Administered 2013-10-05: 1000 mL via INTRAVENOUS
  Administered 2013-10-05: 16:00:00 via INTRAVENOUS

## 2013-10-05 MED ORDER — GLYCOPYRROLATE 0.2 MG/ML IJ SOLN
INTRAMUSCULAR | Status: DC | PRN
  Administered 2013-10-05: .6 mg via INTRAVENOUS

## 2013-10-05 MED ORDER — ONDANSETRON HCL 4 MG/2ML IJ SOLN
INTRAMUSCULAR | Status: DC | PRN
  Administered 2013-10-05: 4 mg via INTRAVENOUS

## 2013-10-05 SURGICAL SUPPLY — 54 items
ADH SKN CLS APL DERMABOND .7 (GAUZE/BANDAGES/DRESSINGS) ×2
BLADE HEX COATED 2.75 (ELECTRODE) ×3 IMPLANT
BLADE SURG 15 STRL LF DISP TIS (BLADE) IMPLANT
BLADE SURG 15 STRL SS (BLADE)
BLADE SURG SZ11 CARB STEEL (BLADE) ×3 IMPLANT
CABLE HIGH FREQUENCY MONO STRZ (ELECTRODE) ×3 IMPLANT
CANISTER SUCTION 2500CC (MISCELLANEOUS) ×3 IMPLANT
DECANTER SPIKE VIAL GLASS SM (MISCELLANEOUS) ×5 IMPLANT
DERMABOND ADVANCED (GAUZE/BANDAGES/DRESSINGS) ×1
DERMABOND ADVANCED .7 DNX12 (GAUZE/BANDAGES/DRESSINGS) ×2 IMPLANT
DEVICE SUT QUICK LOAD TK 5 (STAPLE) ×6 IMPLANT
DEVICE SUT TI-KNOT TK 5X26 (MISCELLANEOUS) ×2 IMPLANT
DEVICE SUTURE ENDOST 10MM (ENDOMECHANICALS) IMPLANT
DRAPE CAMERA CLOSED 9X96 (DRAPES) ×3 IMPLANT
ELECT REM PT RETURN 9FT ADLT (ELECTROSURGICAL) ×3
ELECTRODE REM PT RTRN 9FT ADLT (ELECTROSURGICAL) ×2 IMPLANT
GLOVE BIOGEL PI IND STRL 7.0 (GLOVE) ×2 IMPLANT
GLOVE BIOGEL PI INDICATOR 7.0 (GLOVE) ×1
GLOVE SS BIOGEL STRL SZ 7.5 (GLOVE) ×2 IMPLANT
GLOVE SUPERSENSE BIOGEL SZ 7.5 (GLOVE) ×1
GOWN PREVENTION PLUS LG XLONG (DISPOSABLE) ×3 IMPLANT
GOWN STRL REIN XL XLG (GOWN DISPOSABLE) ×6 IMPLANT
HOVERMATT SINGLE USE (MISCELLANEOUS) ×3 IMPLANT
KIT BASIN OR (CUSTOM PROCEDURE TRAY) ×3 IMPLANT
NDL SPNL 22GX3.5 QUINCKE BK (NEEDLE) ×2 IMPLANT
NEEDLE SPNL 22GX3.5 QUINCKE BK (NEEDLE) ×3 IMPLANT
NS IRRIG 1000ML POUR BTL (IV SOLUTION) ×3 IMPLANT
PACK UNIVERSAL I (CUSTOM PROCEDURE TRAY) ×3 IMPLANT
PENCIL BUTTON HOLSTER BLD 10FT (ELECTRODE) ×3 IMPLANT
SCALPEL HARMONIC ACE (MISCELLANEOUS) IMPLANT
SET IRRIG TUBING LAPAROSCOPIC (IRRIGATION / IRRIGATOR) IMPLANT
SLEEVE ADV FIXATION 5X100MM (TROCAR) ×3 IMPLANT
SOLUTION ANTI FOG 6CC (MISCELLANEOUS) ×3 IMPLANT
SPONGE LAP 18X18 X RAY DECT (DISPOSABLE) ×3 IMPLANT
STAPLER VISISTAT 35W (STAPLE) ×3 IMPLANT
SUT ETHIBOND 2 0 SH (SUTURE)
SUT ETHIBOND 2 0 SH 36X2 (SUTURE) ×6 IMPLANT
SUT MNCRL AB 4-0 PS2 18 (SUTURE) ×2 IMPLANT
SUT MON AB 5-0 PS2 18 (SUTURE) ×1 IMPLANT
SUT PROLENE 2 0 CT2 30 (SUTURE) ×2 IMPLANT
SUT SILK 0 (SUTURE)
SUT SILK 0 30XBRD TIE 6 (SUTURE) ×2 IMPLANT
SUT SURGIDAC NAB ES-9 0 48 120 (SUTURE) IMPLANT
SUT VIC AB 2-0 SH 27 (SUTURE)
SUT VIC AB 2-0 SH 27X BRD (SUTURE) ×2 IMPLANT
SUT VICRYL 0 UR6 27IN ABS (SUTURE) ×1 IMPLANT
SYR 20CC LL (SYRINGE) ×3 IMPLANT
SYR CONTROL 10ML LL (SYRINGE) ×3 IMPLANT
TOWEL OR 17X26 10 PK STRL BLUE (TOWEL DISPOSABLE) ×3 IMPLANT
TROCAR ADV FIXATION 11X100MM (TROCAR) IMPLANT
TROCAR BLADELESS OPT 5 100 (ENDOMECHANICALS) ×6 IMPLANT
TROCAR XCEL NON-BLD 11X100MML (ENDOMECHANICALS) ×1 IMPLANT
TUBE CALIBRATION LAPBAND (TUBING) ×3 IMPLANT
TUBING INSUFFLATION 10FT LAP (TUBING) ×3 IMPLANT

## 2013-10-05 NOTE — Anesthesia Postprocedure Evaluation (Deleted)
Anesthesia Post Note  Patient: Kayla [name]  Procedure(s) Performed: Procedure(s) (LRB): LAPAROSCOPIC REPAIR AND REMOVAL OF GASTRIC BAND (N/A) UPPER GI ENDOSCOPY  Anesthesia type: General  Patient location: PACU  Post pain: Pain level controlled  Post assessment: Post-op Vital signs reviewed  Last Vitals: BP 123/78  Pulse 69  Temp(Src) 36.5 C (Oral)  Resp 18  Ht 5\' 8"  (1.727 m)  Wt 275 lb 12.7 oz (125.1 kg)  BMI 41.94 kg/m2  SpO2 96%  Post vital signs: Reviewed  Level of consciousness: sedated  Complications: No apparent anesthesia complications

## 2013-10-05 NOTE — Anesthesia Preprocedure Evaluation (Signed)
Anesthesia Evaluation  Patient identified by MRN, date of birth, ID band Patient awake    Reviewed: Allergy & Precautions, H&P , NPO status , Patient's Chart, lab work & pertinent test results  Airway Mallampati: II TM Distance: >3 FB Neck ROM: Full    Dental  (+) Dental Advisory Given and Teeth Intact   Pulmonary neg pulmonary ROS,  breath sounds clear to auscultation- rhonchi        Cardiovascular hypertension, Pt. on medications Rhythm:Regular Rate:Normal     Neuro/Psych negative neurological ROS  negative psych ROS   GI/Hepatic Neg liver ROS, GERD-  Medicated,  Endo/Other  diabetes, Type 2, Oral Hypoglycemic AgentsMorbid obesity  Renal/GU negative Renal ROS     Musculoskeletal negative musculoskeletal ROS (+)   Abdominal (+) + obese,   Peds  Hematology negative hematology ROS (+)   Anesthesia Other Findings   Reproductive/Obstetrics negative OB ROS                           Anesthesia Physical Anesthesia Plan  ASA: III  Anesthesia Plan: General   Post-op Pain Management:    Induction: Intravenous  Airway Management Planned: Oral ETT  Additional Equipment:   Intra-op Plan:   Post-operative Plan: Extubation in OR  Informed Consent: I have reviewed the patients History and Physical, chart, labs and discussed the procedure including the risks, benefits and alternatives for the proposed anesthesia with the patient or authorized representative who has indicated his/her understanding and acceptance.   Dental advisory given  Plan Discussed with: CRNA  Anesthesia Plan Comments:         Anesthesia Quick Evaluation

## 2013-10-05 NOTE — Transfer of Care (Signed)
Immediate Anesthesia Transfer of Care Note  Patient: Kayla [name]  Procedure(s) Performed: Procedure(s): LAPAROSCOPIC REPAIR AND REMOVAL OF GASTRIC BAND (N/A) UPPER GI ENDOSCOPY  Patient Location: PACU  Anesthesia Type:General  Level of Consciousness: awake, alert  and oriented  Airway & Oxygen Therapy: Patient Spontanous Breathing and Patient connected to face mask oxygen  Post-op Assessment: Report given to PACU RN and Post -op Vital signs reviewed and stable  Post vital signs: Reviewed and stable  Complications: No apparent anesthesia complications

## 2013-10-05 NOTE — Anesthesia Procedure Notes (Signed)
Procedure Name: Intubation Date/Time: 10/05/2013 2:29 PM Performed by: Leroy Libman L Patient Re-evaluated:Patient Re-evaluated prior to inductionOxygen Delivery Method: Circle system utilized Preoxygenation: Pre-oxygenation with 100% oxygen Intubation Type: IV induction Ventilation: Mask ventilation without difficulty and Oral airway inserted - appropriate to patient size Laryngoscope Size: Miller and 2 Grade View: Grade I Tube type: Oral Tube size: 7.5 mm Number of attempts: 1 Airway Equipment and Method: Stylet Secured at: 21 cm Tube secured with: Tape Dental Injury: Teeth and Oropharynx as per pre-operative assessment

## 2013-10-05 NOTE — OR Nursing (Signed)
Removal of a gastric band per Dr. Ezzard Standing

## 2013-10-05 NOTE — Anesthesia Postprocedure Evaluation (Signed)
Anesthesia Post Note  Patient: Kayla [name]  Procedure(s) Performed: Procedure(s) (LRB): LAPAROSCOPIC REPAIR AND REMOVAL OF GASTRIC BAND (N/A) UPPER GI ENDOSCOPY  Anesthesia type: General  Patient location: PACU  Post pain: Pain level controlled  Post assessment: Post-op Vital signs reviewed  Last Vitals: BP 113/71  Pulse 80  Temp(Src) 36.6 C (Oral)  Resp 18  Ht 5\' 8"  (1.727 m)  Wt 275 lb 12.7 oz (125.1 kg)  BMI 41.94 kg/m2  SpO2 98%  Post vital signs: Reviewed  Level of consciousness: sedated  Complications: No apparent anesthesia complications

## 2013-10-05 NOTE — Progress Notes (Signed)
Chart reviewed and patient examined. Ms. Bruck has a slipped lap band that she is anxious to have removed. I discussed the indications and risks of surgery.  The primary risks include, but are not limited to, bleeding, infection, and injury to the stomach. I also discussed upped endoscopy, if there was some question about the lap band removal. Plan surgery later today.  Ovidio Kin, MD, [facility] Pager: [phone] Office phone:  [phone]

## 2013-10-05 NOTE — Preoperative (Signed)
Beta Blockers   Reason not to administer Beta Blockers:Not Applicable 

## 2013-10-05 NOTE — Progress Notes (Signed)
CARE MANAGEMENT NOTE 10/05/2013  Patient:  Kayla [name], Kayla [name]   Account Number:  [id number]  Date Initiated:  10/05/2013  Documentation initiated by:  DAVIS,RHONDA  Subjective/Objective Assessment:   pt with nausa and vomiting due to slipped lap band will have removed [id number]     Action/Plan:   home when stable   Anticipated DC Date:  10/08/2013   Anticipated DC Plan:  HOME/SELF CARE  In-house referral  NA  NA      DC Planning Services  NA      PAC Choice  NA   Choice offered to / List presented to:  NA   DME arranged  NA      DME agency  NA     HH arranged  NA      HH agency  NA   Status of service:  In process, will continue to follow Medicare Important Message given?  NA - LOS <3 / Initial given by admissions (If response is "NO", the following Medicare IM given date fields will be blank) Date Medicare IM given:   Date Additional Medicare IM given:    Discharge Disposition:    Per UR Regulation:  Reviewed for med. necessity/level of care/duration of stay  If discussed at Long Length of Stay Meetings, dates discussed:    Comments:  [id number]/Rhonda Stark Jock, BSN, [place] [phone] Chart Reviewed for discharge and hospital needs. Discharge needs at time of review:  None present will follow for needs. Review of patient progress due on [id number].

## 2013-10-06 ENCOUNTER — Encounter (HOSPITAL_COMMUNITY): Payer: Self-pay | Admitting: Surgery

## 2013-10-06 MED ORDER — HYDROCODONE-ACETAMINOPHEN 5-325 MG PO TABS: 1.0000 | ORAL_TABLET | ORAL | Status: DC | PRN

## 2013-10-06 NOTE — Op Note (Signed)
NAMEGALINA, HADDOX                 ACCOUNT NO.:  [id number]  MEDICAL RECORD NO.:  [id number]  LOCATION:  1515                         FACILITY:  [facility]  PHYSICIAN:  Sandria Bales. Ezzard Standing, M.D.  DATE OF BIRTH:  1956-01-20  DATE OF PROCEDURE:  10/05/2013                              OPERATIVE REPORT  PREOPERATIVE DIAGNOSIS:  Lap-Band slip with evidence of poor esophageal emptying.  POSTOPERATIVE DIAGNOSIS:  Lap-Band slip with distal esophageal dilation.  PROCEDURE:  Laparoscopic removal of the Lap-Band and upper esophagogastroduodenoscopy.  SURGEON:  Sandria Bales. Ezzard Standing, MD  FIRST ASSISTANT:  None.  ANESTHESIA:  General endotracheal.  ESTIMATED BLOOD LOSS:  Minimal.  INDICATION FOR PROCEDURE:  Ms. Krauss is a 57 year old white female who sees Dr. Eleonore Chiquito as her primary medical doctor.  She had a Lap- Band placed for morbid obesity by Dr. Jaclynn Guarneri on July 26, 2007.  She was unfortunately last seen in the office around 2010, but over the last years had progressive dysphagia and reflux.  She underwent a recent evaluation by Dr. Melvia Heaps who did an upper GI on October 03, 2013, that showed some esophageal dilatation with apparent slipped band.  She saw Dr. Luretha Murphy in office last evening (10/04/2013) and he removed about 1 mL from her band, but this did not really improve her symptoms.  She is not interested in trying to manage the Lap Band.  The patient wished to have her Lap-Band removed and we will honor her wishes.  The risks of surgery include, but not limited to, bleeding, infection, bowel injury.  I also discussed with her about doing an upper endoscopy at the same time as I removed the band because she have so much esophageal symptoms, to make sure there is nothing unrelated to the Lap- Band slip causing her symptoms.  DESCRIPTION OF PROCEDURE:  The patient was taken to room #1 and underwent a general endotracheal anesthesia.  She was given 2 g of Ancef at initiation of  the procedure.    A time-out was held and the surgical checklist run.  I accessed her abdominal cavity with a 5-mm Optiview in the left upper quadrant.  I placed a 5-mm trocar to the right of midline and a 15 mm in the right subcostal and used a 5 mm to put the [place] retractor in.  So, she had 4 ports placed.  Laparoscopic exploration revealed right and left lobes of liver unremarkable.  The stomach that I could see was unremarkable.  The bowel that I could see was unremarkable.  She had a band under the left lobeof the liver with  some adhesions around this.  I took these down with sharp dissection.  Really from a slip standpoint, they did not look that bad.  Photos were taken to be placed in the chart.  I unbuckled the band, divided the band, removed it in pieces.  I then inspected the tract, there was no evidence of any injury to the stomach.  At this time, I broke scrub and did an upper endoscopy using a Pentax upper endoscope.  I passed this down her esophagus and into her stomach and into the duodenum without  difficulty.  Her duodenum was unremarkable.  In her antrum, she had a little bit of bile reflux.  I am not sure why she should have some bile reflux, but the remainder of her gastric mucosa looked okay.  I retroflexed the scope to visualize the gastric cardia, i saw the imprint where the band was before.  There is no evidence of any erosion or inflammation in this area.  The scope was then withdrawn.  The band orifice was at about 42-43 cm.  Her esophagogastric junction was about 38 cm.  She did have evidence of dilatation of her distal esophagus with some irritation, but there was no suspicious mass or lesion that would be leading to her symptoms.  I think her symptoms were related to her lap band.  Photos were taken.  The scope was withdrawn.    I removed the Lap Band port without incident.  The liver retractor had been removed.  I re-laparoscoped the patient to check the lap band  removal site.  I checked the port sites.  Each port was removed without difficulty.  I closed the port site with a 0 Vicryl suture.  The skin at each port site was closed with 5-0 Monocryl.  Sponge and needle count were correct at the end of the case. Patient was transferred to recovery room in good condition, was started on diet today, and keep her overnight with plans to let her go home tomorrow if things are going well.  Sandria Bales. Ezzard Standing, M.D., FACS   DHN/MEDQ  D:  10/05/2013  T:  10/06/2013  Job:  [id number]  cc:   Gordy Savers, MD  Barbette Hair. Arlyce Dice, MD,FACG

## 2013-10-06 NOTE — Discharge Summary (Signed)
Physician Discharge Summary  Kayla [name] VHQ:[id number] DOB: 12-09-1955 DOA: 10/04/2013  PCP: Rogelia Boga, MD  Consultation: none  Admit date: 10/04/2013 Discharge date: 10/06/2013  Recommendations for Outpatient Follow-up:   Follow-up Information   Call [facility] H, MD. (for a follow up in 2-3 weeks)    Specialty:  General Surgery   Contact information:   [address] [place] [address] [phone]      Discharge Diagnoses:  1. Slipped lap band s/p laparoscopic removal   Surgical Procedure: Laparoscopic removal of gastric band, upper endoscopy (Dr. Ezzard Standing 10/05/13)  Discharge Condition: stable Disposition: home  Diet recommendation: as tolerated   Filed Weights   10/04/13 1844 10/05/13 0531  Weight: 277 lb 5.4 oz (125.8 kg) 275 lb 12.7 oz (125.1 kg)    Filed Vitals:   10/06/13 0600  BP: 108/63  Pulse: 80  Temp: 97.4 F (36.3 C)  Resp: 20      Hospital Course:  Kayla [name] is a 57 year old female who had a lapband APS placed in October of 2008 by Dr. Johna Sheriff.  She was doing well until about 1 year ago at which time she developed GERD symptoms.  She lost follow up with our clinic.  She had an UGI on 10/03/13 which showed that her lap band had slipped causing a near obstruction.  She was found to be dehydrated and therefore referred from our clinic to [facility] hospital for hydration.  She underwent the lap band removal the following day.  She tolerated the surgery quite well.  She was transferred to the floor and started on a diet.  Diet was advanced as tolerated.  Her labs and vital signs were stable.  On POD#1, patient was tolerating her diet, pain well controlled with oral pain medication, passing flatus, ambulating and had stable vitals signs.  She was therefore felt stable for discharge.  She was advised to follow up in 2 weeks.  We discussed warning signs that warrant immediate referral.  Pain medication side effects were reviewed.      Physical Exam: Abd: soft, appropriately tender. +bs.  Non distended, incisions are c/d/i  Discharge Instructions   Future Appointments Provider Department Dept Phone   09/21/2014 8:00 AM [facility] [phone]   09/28/2014 8:45 AM Gordy Savers, MD  [facility] [phone]       Medication List         acetaminophen 500 MG tablet  Commonly known as:  TYLENOL  Take 500 mg by mouth every 6 (six) hours as needed for moderate pain or headache.     fexofenadine 180 MG tablet  Commonly known as:  ALLEGRA  Take 180 mg by mouth every morning.     fluticasone 50 MCG/ACT nasal spray  Commonly known as:  FLONASE  Place 2 sprays into both nostrils daily as needed for allergies or rhinitis.     HYDROcodone-acetaminophen 5-325 MG per tablet  Commonly known as:  NORCO/VICODIN  Take 1 tablet by mouth every 4 (four) hours as needed for moderate pain.     lisinopril-hydrochlorothiazide 20-12.5 MG per tablet  Commonly known as:  PRINZIDE,ZESTORETIC  Take 1 tablet by mouth every morning.     montelukast 10 MG tablet  Commonly known as:  SINGULAIR  Take 10 mg by mouth at bedtime.     multivitamin tablet  Take 1 tablet by mouth every morning.     pantoprazole 40 MG tablet  Commonly known  as:  PROTONIX  Take 40 mg by mouth every morning.     progesterone 100 MG capsule  Commonly known as:  PROMETRIUM  Take 200 mg by mouth at bedtime.     promethazine 25 MG tablet  Commonly known as:  PHENERGAN  Take 25 mg by mouth at bedtime.     ranitidine 75 MG tablet  Commonly known as:  ZANTAC  Take 75 mg by mouth 2 (two) times daily.     Thyroid 81.25 MG Tabs  Take 1 tablet by mouth 2 (two) times daily.           Follow-up Information   Call [facility] H, MD. (for a follow up in 2-3 weeks)    Specialty:  General Surgery   Contact information:   [address] [place] [address] [phone]         The results of significant diagnostics from this hospitalization (including imaging, microbiology, ancillary and laboratory) are listed below for reference.    Significant Diagnostic Studies: Dg Esophagus  10/03/2013   CLINICAL DATA:  Post remote lap band placement. Difficulty swallowing  EXAM: ESOPHOGRAM/BARIUM SWALLOW  TECHNIQUE: Single contrast examination was performed using  thin barium.  COMPARISON:  06/28/2009  FLUOROSCOPY TIME:  One minute and 17 seconds.  FINDINGS: On the scout view, lap band is in place. When the patient is placed in upright position, the lap band is directed between the 2 and 8 o'clock position.  With the patient in an upright position, small amount of thin barium was ingested. There was significant holdup of forward flow at the level of the lap band. With delayed imaging, only a minimal amount of the ingested contrast traversed beyond the lap band where significant narrowing is noted. The study was terminated at this point.  IMPRESSION: Marked narrowing at the level of the lap band with significant decreased forward flow of ingested barium. Study was terminated at this point. Please see above.  These results were called by telephone at the time of interpretation on 10/03/2013 at 11:05 AM to Dr. Melvia Heaps 's nurse Bonita Quin, who verbally acknowledged these results.   Electronically Signed   By: Bridgett Larsson M.D.   On: 10/03/2013 11:28    Microbiology: Recent Results (from the past 240 hour(s))  MRSA PCR SCREENING     Status: None   Collection Time    10/05/13  9:58 AM      Result Value Range Status   MRSA by PCR NEGATIVE  NEGATIVE Final   Comment:            The GeneXpert MRSA Assay (FDA     approved for NASAL specimens     only), is one component of a     comprehensive MRSA colonization     surveillance program. It is not     intended to diagnose MRSA     infection nor to guide or     monitor treatment for     MRSA infections.     Labs: Basic Metabolic  Panel:  Recent Labs Lab 10/04/13 2004  CREATININE 0.93   Liver Function Tests: No results found for this basename: AST, ALT, ALKPHOS, BILITOT, PROT, ALBUMIN,  in the last 168 hours No results found for this basename: LIPASE, AMYLASE,  in the last 168 hours No results found for this basename: AMMONIA,  in the last 168 hours CBC:  Recent Labs Lab 10/04/13 2004  WBC 6.2  HGB 11.9*  HCT 35.3*  MCV 84.0  PLT 252    Active Problems:   Complication of gastric banding   Signed:  Emina Riebock, ANP-BC  Looks good early. I warned her she may have some swallowing issues the first week or two, but this should resolve.  Ovidio Kin, MD, [facility] Pager: [phone] Office phone:  [phone]

## 2013-10-06 NOTE — Progress Notes (Signed)
Patient discharged in stable medical condition.  Tolerated regular foods for lunch.  Patient verbalized an understanding of all discharge instructions, and was assisted to car.  Patient's son to transport patient home.  Philomena Doheny RN

## 2013-10-14 ENCOUNTER — Other Ambulatory Visit: Payer: Self-pay | Admitting: Internal Medicine

## 2013-10-26 ENCOUNTER — Ambulatory Visit (INDEPENDENT_AMBULATORY_CARE_PROVIDER_SITE_OTHER): Payer: BC Managed Care – PPO | Admitting: Surgery

## 2013-10-26 DIAGNOSIS — Z9884 Bariatric surgery status: Secondary | ICD-10-CM

## 2013-10-26 NOTE — Progress Notes (Signed)
[facility]  Ovidio Kinavid Marvelle Caudill, MD,  FACS [address]  [address] [place], [place]    [id number] Phone:  [phone] FAX:  [phone]   Re:   [place] ConstableDebra P Mathers DOB:   Aug 30, 1956 MRN:   [id number]  I was about 1 hour late and patient left without being seen.  At this time, she has no follow up appt.  ASSESSMENT AND PLAN: 1.  Slipped lap band  Lap band removed 10/05/2013  Originally placed lap band - 07/26/2007 - B. Hoxworth  Original weight - 306, BMI - 42.8  HISTORY OF PRESENT ILLNESS: No chief complaint on file.   [place] ConstableDebra P Scallan is a 58 y.o. (DOB: Aug 30, 1956)  white  female who is a patient of Rogelia BogaKWIATKOWSKI,PETER FRANK, MD and comes to me today for follow up of lap band removal.  Ms Memory Argueance had a lap band placed 07/26/2007, but had difficulty getting it adjusted correctly.  She had not seen us for some time.  Past Medical History  Diagnosis Date  . GERD 11/11/2010  . Hematemesis 11/11/2010  . VASOVAGAL SYNCOPE 11/11/2010  . Obesity   . Diabetes mellitus     SOCIAL HISTORY:   PHYSICAL EXAM: There were no vitals taken for this visit.    DATA REVIEWED: Epic notes   Ovidio Kinavid Lucresia Simic, MD,  [facility], [place] [address]  [address]   [place], [place]    [id number] Phone:  [phone] FAX:  [phone]

## 2013-11-03 ENCOUNTER — Encounter (INDEPENDENT_AMBULATORY_CARE_PROVIDER_SITE_OTHER): Payer: Self-pay | Admitting: Surgery

## 2013-11-03 ENCOUNTER — Ambulatory Visit (INDEPENDENT_AMBULATORY_CARE_PROVIDER_SITE_OTHER): Payer: BC Managed Care – PPO | Admitting: Surgery

## 2013-11-03 VITALS — BP 132/80 | HR 80 | Resp 18 | Ht 70.0 in | Wt 286.0 lb

## 2013-11-03 DIAGNOSIS — Z9884 Bariatric surgery status: Secondary | ICD-10-CM

## 2013-11-03 NOTE — Progress Notes (Signed)
[facility]  Ovidio Kinavid Seila Liston, MD,  FACS [address]  [address] [place], [place]    [id number] Phone:  [phone] FAX:  [phone]   Re:   Ari[place] ConstableDebra P Ammons DOB:   Jun 27, 1956 MRN:   [id number]  ASSESSMENT AND PLAN: 1.  Slipped lap band  Lap band removed 10/05/2013  Originally placed lap band - 07/26/2007 - B. Hoxworth  Original weight - 306, BMI - 42.8  All her upper GI symptoms have resolved.  She is happy to have the lap band out.  Her return appt is PRN.  HISTORY OF PRESENT ILLNESS: Chief Complaint  Patient presents with  . Routine Post Op    removal of lap band   [place] ConstableDebra P Difrancesco is a 58 y.o. (DOB: Jun 27, 1956)  white  female who is a patient of Rogelia BogaKWIATKOWSKI,PETER FRANK, MD and comes to me today for follow up of lap band removal.  Ms Memory Argueance had a lap band placed 07/26/2007, but had difficulty getting it adjusted correctly.  She had not seen us for some time. She presented with a partial slip of the lap band.  She did not want to deal with the lap band anymore and she did not want to try any adjustments.  She just wanted it out. I removed her lap band on 10/05/2013. She has done very well since then.  Past Medical History  Diagnosis Date  . GERD 11/11/2010  . Hematemesis 11/11/2010  . VASOVAGAL SYNCOPE 11/11/2010  . Obesity   . Diabetes mellitus    SOCIAL HISTORY:  PHYSICAL EXAM: BP 132/80  Pulse 80  Resp 18  Ht 5\' 10"  (1.778 m)  Wt 286 lb (129.729 kg)  BMI 41.04 kg/m2  General:  WN WF who is alert Abdomen:  Wounds okay.  Abdomen soft.  DATA REVIEWED: Epic notes   Ovidio Kinavid Stalin Gruenberg, MD,  [facility], [place] [address]  [address]   [place], [place]    [id number] Phone:  [phone] FAX:  [phone]

## 2014-01-13 ENCOUNTER — Other Ambulatory Visit: Payer: Self-pay | Admitting: Internal Medicine

## 2014-04-21 ENCOUNTER — Other Ambulatory Visit: Payer: Self-pay | Admitting: Internal Medicine

## 2014-08-04 ENCOUNTER — Other Ambulatory Visit: Payer: Self-pay

## 2014-08-22 ENCOUNTER — Other Ambulatory Visit: Payer: Self-pay | Admitting: Internal Medicine

## 2014-09-21 ENCOUNTER — Other Ambulatory Visit: Payer: BC Managed Care – PPO

## 2014-09-28 ENCOUNTER — Encounter: Payer: BC Managed Care – PPO | Admitting: Internal Medicine

## 2014-11-16 ENCOUNTER — Other Ambulatory Visit: Payer: Self-pay | Admitting: Internal Medicine

## 2014-12-26 ENCOUNTER — Other Ambulatory Visit (HOSPITAL_COMMUNITY): Payer: Self-pay | Admitting: Specialist

## 2014-12-26 ENCOUNTER — Ambulatory Visit (HOSPITAL_COMMUNITY)
Admission: RE | Admit: 2014-12-26 | Discharge: 2014-12-26 | Disposition: A | Discharge status: Home / Self Care | Payer: BLUE CROSS/BLUE SHIELD | Source: Ambulatory Visit | Attending: Cardiology | Admitting: Cardiology

## 2014-12-26 DIAGNOSIS — M79662 Pain in left lower leg: Secondary | ICD-10-CM

## 2014-12-26 NOTE — Progress Notes (Signed)
Left Lower Extremity Venous Duplex Completed. No evidence for DVT or SVT. °Brianna L Mazza,RVT °

## 2015-01-02 ENCOUNTER — Telehealth (HOSPITAL_COMMUNITY): Payer: Self-pay | Admitting: *Deleted

## 2015-07-02 ENCOUNTER — Other Ambulatory Visit (HOSPITAL_COMMUNITY): Payer: Self-pay | Admitting: Specialist

## 2015-07-02 ENCOUNTER — Ambulatory Visit (HOSPITAL_COMMUNITY)
Admission: RE | Admit: 2015-07-02 | Discharge: 2015-07-02 | Disposition: A | Discharge status: Home / Self Care | Payer: BLUE CROSS/BLUE SHIELD | Source: Ambulatory Visit | Attending: Cardiology | Admitting: Cardiology

## 2015-07-02 DIAGNOSIS — M79662 Pain in left lower leg: Secondary | ICD-10-CM | POA: Diagnosis not present

## 2015-07-02 DIAGNOSIS — I10 Essential (primary) hypertension: Secondary | ICD-10-CM | POA: Diagnosis not present

## 2016-08-20 DIAGNOSIS — Z1231 Encounter for screening mammogram for malignant neoplasm of breast: Secondary | ICD-10-CM | POA: Diagnosis not present

## 2016-08-20 LAB — HM MAMMOGRAPHY

## 2016-08-21 ENCOUNTER — Encounter: Payer: Self-pay | Admitting: Internal Medicine

## 2016-11-19 ENCOUNTER — Ambulatory Visit: Payer: Self-pay | Admitting: Podiatry

## 2016-11-20 ENCOUNTER — Ambulatory Visit (INDEPENDENT_AMBULATORY_CARE_PROVIDER_SITE_OTHER): Payer: BLUE CROSS/BLUE SHIELD | Admitting: Podiatry

## 2016-11-20 ENCOUNTER — Encounter: Payer: Self-pay | Admitting: Podiatry

## 2016-11-20 ENCOUNTER — Ambulatory Visit: Payer: Self-pay | Admitting: Podiatry

## 2016-11-20 ENCOUNTER — Ambulatory Visit (INDEPENDENT_AMBULATORY_CARE_PROVIDER_SITE_OTHER): Payer: BLUE CROSS/BLUE SHIELD

## 2016-11-20 VITALS — HR 65 | Resp 16 | Ht 73.0 in | Wt 285.0 lb

## 2016-11-20 DIAGNOSIS — M722 Plantar fascial fibromatosis: Secondary | ICD-10-CM

## 2016-11-20 MED ORDER — TRIAMCINOLONE ACETONIDE 10 MG/ML IJ SUSP
10.0000 mg | Freq: Once | INTRAMUSCULAR | Status: AC
  Administered 2016-11-20: 10 mg

## 2016-11-20 NOTE — Patient Instructions (Signed)

## 2016-11-20 NOTE — Progress Notes (Signed)
   Subjective:    Patient ID: Kayla [name], female    DOB: Aug 18, 1956, 61 y.o.   MRN: [id number]  HPI  Chief Complaint  Patient presents with  . Foot Pain    Right; bottom of heel x 5 months. Pt has been [facility] with Plantar Fasc[facility]        Review of Systems     Objective:   Physical Exam        Assessment & Plan:

## 2016-11-21 NOTE — Progress Notes (Signed)
Subjective:     Patient ID: Kayla [name], female   DOB: [birth year], 61 y.o.   MRN: [id number]  HPI patient states I've had severe heel pain for over a year and I've had active treatment that included injection treatments boot therapy orthotics and other modalities without relief and the pain is severe and I cannot do any form of ambulation and I am gaining weight and I cannot be active   Review of Systems  All other systems reviewed and are negative.      Objective:   Physical Exam  Constitutional: She is oriented to person, place, and time.  Cardiovascular: Intact distal pulses.   Musculoskeletal: Normal range of motion.  Neurological: She is oriented to person, place, and time.  Skin: Skin is warm.  Nursing note and vitals reviewed.  neurovascular status found to be intact muscle strength was adequate with range of motion within normal limits. Patient is noted to have severe discomfort medial band right heel at the insertional point of the tendon into the calcaneus with fluid buildup around the medial band. Patient does have moderate depression of the arch and is noted to have good digital perfusion and is well oriented 3     Assessment:     Acute inflammatory fasciitis right long-term nature with failure to respond to conservative care    Plan:     H&P x-rays reviewed condition discussed at great length. Due to long-standing nature I do think we are dealing with a chronic problem that has failed to respond to conservative care and is not allowing her to be active or to enjoy life. I reviewed this with her at great length and she wants to have surgery on this but is going [place] at the end of February believe she should wait till after this time. I did go ahead today to try to give her relief I injected the plantar fascia 3 Milligan Kenalog 5 mg Xylocaine and dispensed air fracture walker to reduce pressure. I reviewed surgery which will be done in March and she will reappoint in  February to go over this  X-ray indicates that there small spur with no indications of stress fracture or arthritis

## 2016-11-27 ENCOUNTER — Encounter: Payer: Self-pay | Admitting: Podiatry

## 2016-11-27 NOTE — Progress Notes (Signed)
Put CD of x-rays to be mailed out tomorrow Friday 09 2018 to patient per patient request.

## 2016-12-11 ENCOUNTER — Telehealth: Payer: Self-pay | Admitting: *Deleted

## 2016-12-11 ENCOUNTER — Encounter: Payer: Self-pay | Admitting: Podiatry

## 2016-12-11 ENCOUNTER — Ambulatory Visit (INDEPENDENT_AMBULATORY_CARE_PROVIDER_SITE_OTHER): Payer: BLUE CROSS/BLUE SHIELD | Admitting: Podiatry

## 2016-12-11 VITALS — BP 116/63 | HR 80 | Resp 16

## 2016-12-11 DIAGNOSIS — M722 Plantar fascial fibromatosis: Secondary | ICD-10-CM

## 2016-12-11 MED ORDER — TRIAMCINOLONE ACETONIDE 10 MG/ML IJ SUSP
10.0000 mg | Freq: Once | INTRAMUSCULAR | Status: AC
  Administered 2016-12-11: 10 mg

## 2016-12-11 NOTE — Progress Notes (Signed)
Subjective:     Patient ID: Kayla [name], female   DOB: 1956-08-16, 61 y.o.   MRN: [id number]  HPI patient states my foot feeling quite a bit better and I just wanted to be checked. I do not want to have surgery if I can avoid it but I'm looking for any other way to get out a relief as I've had problems with my heel now for 2 years   Review of Systems     Objective:   Physical Exam Neurological sensation found to be intact along with vascular with patient found to have tenderness still in the plantar fascial right at the insertional point into the calcaneus with diminishment of discomfort from previous    Assessment:     Plantar fasciitis right with inflammation still noted    Plan:     Reinjected the plantar fascial right 3 mg Kenalog 5 mg Xylocaine and discussed long-term shockwave to try to solve problems nonsurgically. Patient wants shockwave and is scheduled for shockwave therapy. Educated her on shockwave and risks

## 2016-12-11 NOTE — Telephone Encounter (Signed)
"  She said she had called the office sometime ago and canceled surgery for 12/23/2016. "  I called and left Aram BeechamCynthia a message to cancel surgery.  We were not aware that patient wanted to cancel surgery until today , she came in for an appointment with Dr. Charlsie Merlesegal.  [name] going to try a non-invasive treatment here in the office.

## 2016-12-24 ENCOUNTER — Ambulatory Visit (INDEPENDENT_AMBULATORY_CARE_PROVIDER_SITE_OTHER): Payer: BLUE CROSS/BLUE SHIELD | Admitting: Podiatry

## 2016-12-24 DIAGNOSIS — M722 Plantar fascial fibromatosis: Secondary | ICD-10-CM

## 2016-12-24 DIAGNOSIS — R52 Pain, unspecified: Secondary | ICD-10-CM

## 2016-12-24 NOTE — Progress Notes (Signed)
   Subjective:    Patient ID: Kayla [name], female    DOB: 1955/12/28, 61 y.o.   MRN: [id number]  HPI Pt presents with ongoing heel pain with no response to various other treatments  Review of Systems    All other systems negative Objective:   Physical Exam   Pain on palpation to Rt heel     Assessment & Plan:  ESWT administered to  Rt heel for 10.6 joules, EPAT to surrounding connective tissues for 3000 pulses. All tolerated well. Re-appointed in 1 week for 2nd treatment

## 2017-01-02 ENCOUNTER — Ambulatory Visit (INDEPENDENT_AMBULATORY_CARE_PROVIDER_SITE_OTHER): Payer: BLUE CROSS/BLUE SHIELD

## 2017-01-02 DIAGNOSIS — M722 Plantar fascial fibromatosis: Secondary | ICD-10-CM

## 2017-01-08 ENCOUNTER — Ambulatory Visit (INDEPENDENT_AMBULATORY_CARE_PROVIDER_SITE_OTHER): Payer: Self-pay | Admitting: Podiatry

## 2017-01-08 DIAGNOSIS — M722 Plantar fascial fibromatosis: Secondary | ICD-10-CM

## 2017-01-08 NOTE — Progress Notes (Signed)
   Subjective:    Patient ID: Kayla [name], female    DOB: 26-Oct-1955, 61 y.o.   MRN: [id number]  HPI Pt presents with ongoing heel pain with no response to various other treatments  Review of Systems    All other systems negative Objective:   Physical Exam   Pain on palpation to Rt heel     Assessment & Plan:  ESWT administered to  Rt heel for 12.5 joules, EPAT to surrounding connective tissues for 3000 pulses. All tolerated well. Re-appointed in 4 weeks for 4th  treatment

## 2017-01-21 NOTE — Progress Notes (Signed)
   Subjective:    Patient ID: Kayla [name], female    DOB: [birth year], 61 y.o.   MRN: [id number]  HPI Pt presents with ongoing heel pain with no response to various other treatments  Review of Systems    All other systems negative Objective:   Physical Exam   Pain on palpation to Rt heel     Assessment & Plan:  ESWT administered to  Rt heel for 12 joules, EPAT to surrounding connective tissues for 3000 pulses. All tolerated well. Re-appointed in 1 week for 3rd treatment

## 2017-02-10 ENCOUNTER — Ambulatory Visit (INDEPENDENT_AMBULATORY_CARE_PROVIDER_SITE_OTHER): Payer: BLUE CROSS/BLUE SHIELD | Admitting: Podiatry

## 2017-02-10 DIAGNOSIS — M722 Plantar fascial fibromatosis: Secondary | ICD-10-CM

## 2017-02-13 NOTE — Progress Notes (Signed)
   Subjective:    Patient ID: Kayla P NVAIDA KERCHNERle    DOB: [birth year], 61 y.o.   MRN: [id number]  HPI Pt presents with ongoing heel pain with no response to various other treatments  Review of Systems    All other systems negative Objective:   Physical Exam   Pain on palpation to Rt heel     Assessment & Plan:  ESWT administered to  Rt heel for 13 joules, EPAT to surrounding connective tissues for 3000 pulses. All tolerated well. Re-appointed in 6 weeks with Dr Charlsie Merles

## 2017-03-25 ENCOUNTER — Ambulatory Visit: Payer: BLUE CROSS/BLUE SHIELD | Admitting: Podiatry

## 2017-06-30 DIAGNOSIS — H2513 Age-related nuclear cataract, bilateral: Secondary | ICD-10-CM | POA: Diagnosis not present

## 2017-06-30 DIAGNOSIS — H5203 Hypermetropia, bilateral: Secondary | ICD-10-CM | POA: Diagnosis not present

## 2017-06-30 DIAGNOSIS — H04123 Dry eye syndrome of bilateral lacrimal glands: Secondary | ICD-10-CM | POA: Diagnosis not present

## 2017-07-09 ENCOUNTER — Encounter: Payer: Self-pay | Admitting: Internal Medicine

## 2017-07-28 DIAGNOSIS — Z0001 Encounter for general adult medical examination with abnormal findings: Secondary | ICD-10-CM | POA: Diagnosis not present

## 2017-07-28 DIAGNOSIS — M1990 Unspecified osteoarthritis, unspecified site: Secondary | ICD-10-CM | POA: Diagnosis not present

## 2017-07-28 DIAGNOSIS — E748 Other specified disorders of carbohydrate metabolism: Secondary | ICD-10-CM | POA: Diagnosis not present

## 2017-07-28 DIAGNOSIS — E039 Hypothyroidism, unspecified: Secondary | ICD-10-CM | POA: Diagnosis not present

## 2017-08-26 DIAGNOSIS — R7982 Elevated C-reactive protein (CRP): Secondary | ICD-10-CM | POA: Diagnosis not present

## 2017-08-26 DIAGNOSIS — E559 Vitamin D deficiency, unspecified: Secondary | ICD-10-CM | POA: Diagnosis not present

## 2017-08-26 DIAGNOSIS — E039 Hypothyroidism, unspecified: Secondary | ICD-10-CM | POA: Diagnosis not present

## 2017-08-26 DIAGNOSIS — N951 Menopausal and female climacteric states: Secondary | ICD-10-CM | POA: Diagnosis not present

## 2017-09-15 ENCOUNTER — Telehealth: Payer: Self-pay | Admitting: *Deleted

## 2017-09-15 NOTE — Telephone Encounter (Signed)
Pt states she had been doing fine, and 1 month ago the plantar fasciitis started again and is worsening, does she need an appt or just an antiinflammatory medication. Left message informing pt her last appt was 01/2017 and we would need to see her again prior to ordering any medication.

## 2017-09-18 ENCOUNTER — Ambulatory Visit (INDEPENDENT_AMBULATORY_CARE_PROVIDER_SITE_OTHER): Payer: BLUE CROSS/BLUE SHIELD | Admitting: Podiatry

## 2017-09-18 ENCOUNTER — Encounter: Payer: Self-pay | Admitting: Podiatry

## 2017-09-18 DIAGNOSIS — M722 Plantar fascial fibromatosis: Secondary | ICD-10-CM | POA: Diagnosis not present

## 2017-09-18 MED ORDER — TRIAMCINOLONE ACETONIDE 10 MG/ML IJ SUSP
10.0000 mg | Freq: Once | INTRAMUSCULAR | Status: AC
  Administered 2017-09-18: 10 mg

## 2017-09-18 MED ORDER — MELOXICAM 15 MG PO TABS: 15.0000 mg | ORAL_TABLET | Freq: Every day | ORAL | 2 refills | Status: DC

## 2017-09-18 NOTE — Progress Notes (Signed)
Subjective:   Patient ID: Kayla [name], female   DOB: 61 y.o.   MRN: [id number]   HPI Patient presents stating she did great with shockwave for her heel but she was extremely active over the last few weeks and developed a recurrence.  Patient admits that her orthotics are falling apart she is not been wearing them and that she knows she needs to be in orthotic therapy   ROS      Objective:  Physical Exam  Neurovascular status intact with discomfort which is recurred in the plantar aspect of the right heel with patient having gone hiking and developing this discomfort     Assessment:  Recurrence of plantar fasciitis right heel 10 months after treatment     Plan:  At this point I went ahead and I reinjected the plantar fascia 3 mg Kenalog 5 mg Xylocaine placed on anti-inflammatory and I scan for customized orthotics to reduce plantar pressure.  Patient will have these dispensed by ped orthotist and will be seen back for reevaluation

## 2017-09-21 ENCOUNTER — Encounter: Payer: Self-pay | Admitting: Internal Medicine

## 2017-09-21 DIAGNOSIS — Z1231 Encounter for screening mammogram for malignant neoplasm of breast: Secondary | ICD-10-CM | POA: Diagnosis not present

## 2017-09-24 ENCOUNTER — Encounter: Payer: Self-pay | Admitting: Internal Medicine

## 2017-09-24 DIAGNOSIS — N951 Menopausal and female climacteric states: Secondary | ICD-10-CM | POA: Diagnosis not present

## 2017-09-24 DIAGNOSIS — N6001 Solitary cyst of right breast: Secondary | ICD-10-CM | POA: Diagnosis not present

## 2017-09-24 DIAGNOSIS — N6314 Unspecified lump in the right breast, lower inner quadrant: Secondary | ICD-10-CM | POA: Diagnosis not present

## 2017-10-08 ENCOUNTER — Other Ambulatory Visit: Payer: BLUE CROSS/BLUE SHIELD | Admitting: Orthotics

## 2017-10-26 ENCOUNTER — Ambulatory Visit: Payer: BLUE CROSS/BLUE SHIELD | Admitting: Orthotics

## 2017-10-26 DIAGNOSIS — M722 Plantar fascial fibromatosis: Secondary | ICD-10-CM

## 2017-10-26 NOTE — Progress Notes (Signed)
Patient came in today to pick up custom made foot orthotics.  The goals were accomplished and the patient reported no dissatisfaction with said orthotics.  Patient was advised of breakin period and how to report any issues. 

## 2017-12-02 DIAGNOSIS — M545 Low back pain: Secondary | ICD-10-CM | POA: Diagnosis not present

## 2017-12-02 DIAGNOSIS — M5417 Radiculopathy, lumbosacral region: Secondary | ICD-10-CM | POA: Diagnosis not present

## 2017-12-02 DIAGNOSIS — M5136 Other intervertebral disc degeneration, lumbar region: Secondary | ICD-10-CM | POA: Diagnosis not present

## 2017-12-10 DIAGNOSIS — M545 Low back pain: Secondary | ICD-10-CM | POA: Diagnosis not present

## 2017-12-11 ENCOUNTER — Ambulatory Visit: Payer: Self-pay | Admitting: Orthopedic Surgery

## 2017-12-11 ENCOUNTER — Telehealth: Payer: Self-pay | Admitting: *Deleted

## 2017-12-11 ENCOUNTER — Encounter (HOSPITAL_COMMUNITY): Payer: Self-pay | Admitting: *Deleted

## 2017-12-11 ENCOUNTER — Other Ambulatory Visit: Payer: Self-pay

## 2017-12-11 DIAGNOSIS — M545 Low back pain: Secondary | ICD-10-CM | POA: Diagnosis not present

## 2017-12-11 DIAGNOSIS — M5416 Radiculopathy, lumbar region: Secondary | ICD-10-CM | POA: Diagnosis not present

## 2017-12-11 DIAGNOSIS — M7138 Other bursal cyst, other site: Secondary | ICD-10-CM | POA: Diagnosis not present

## 2017-12-11 NOTE — Telephone Encounter (Signed)
Refill request for Meloxicam. Dr. Regal states pt needs an appt. Return fax denying. 

## 2017-12-11 NOTE — H&P (View-Only) (Signed)
Kayla [name] is an 61 y.o. female.   Chief Complaint: back and R leg pain HPI: Reason for Visit: Diagnositc Results (lumbar spine)  Context: The patient is 6 1/2 months out from when symptoms began  Location (Lower Extremity): lower back pain ; leg pain on the right, , lateral Severity: pain level 9/10; severe  Timing: constant  Medications: The patient is currently taking Tylenol and gabapentin Notes: She follows up today accompanied by her husband to review her MRI. She reports the prednisone did not help with pain at all, but it did make her hyper and she gained 6 pounds while taking it. Gabapentin does not seem to be helping at all with pain. She reports ongoing pain radiating down the right leg in the L5 distribution with sharp stabbing pains especially in the lower leg. Her right leg is also numb and tingly and she does note that it feels weak with weightbearing. Her left leg is starting to feel tired and she attributes this to compensating for the right side. She is taking Tylenol for pain rright now since the gabapentin does not seem to be helping. At last visit she had wanted to avoid any narcotic medications because her nephew died of an overdose from narcotics. She reports that her symptoms really started around Thanksgiving, initially she thought it was about 6 months ago, but they have been significantly worse over the last month. She has found herself dragging her feet by the end of the day due to weakness. She is tearful today and feels that the pain is unbearable. Many times she has almost gone to the emergency room. He denies any bowel or bladder incontinence.  Past Medical History:  Diagnosis Date  . Arthritis   . GERD 11/11/2010  . Heart murmur    as a chld   . Hematemesis 11/11/2010  . Hypertension   . Hypothyroidism   . Obesity   . VASOVAGAL SYNCOPE 11/11/2010    Past Surgical History:  Procedure Laterality Date  . ABDOMINAL HYSTERECTOMY    . CHOLECYSTECTOMY    . KNEE  ARTHROSCOPY Left   . LAPAROSCOPIC GASTRIC BANDING    . LAPAROSCOPIC REPAIR AND REMOVAL OF GASTRIC BAND N/A 10/05/2013   Procedure: LAPAROSCOPIC REPAIR AND REMOVAL OF GASTRIC BAND;  Surgeon: David H Newman, MD;  Location: [facility];  Service: General;  Laterality: N/A;  . 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 UPPER GI ENDOSCOPY  10/05/2013   Procedure: UPPER GI ENDOSCOPY;  Surgeon: David H Newman, MD;  Location: [facility];  Service: General;;    Family History  Problem Relation Age of Onset  . Alzheimer's disease Mother   . Hypertension Mother   . Hypertension Father   . Dementia Father   . Other Father        stomcah surgery with ostomy bag  . Asthma Paternal Grandmother    Social History:  reports that  has never smoked. she has never used smokeless tobacco. She reports that she does not drink alcohol or use drugs.  Allergies:  Allergies  Allergen Reactions  . Dairy Aid [Lactase] Other (See Comments)    Diarrhea, vomiting, stomach cramps Patient reports she no longer has this as an intolerance   . Eggs Or Egg-Derived Products Diarrhea    Vomiting, stomach cramps Patient reports on 12/11/17 that she no longer has as an intolerance   . Gluten Meal Diarrhea    Vomiting, stomach cramps On 12/11/2017 patient   reports she no longer has as an intolerance   . Asa Arthritis Strength-Antacid [Aspirin Buffered] Hives and Swelling  . Cefaclor Hives  . Morphine Nausea And Vomiting  . Penicillins Other (See Comments)    Childhood allergy - severe reaction  . Tetracycline Hives     (Not in a hospital admission)  No results found for this or any previous visit (from the past 48 hour(s)). No results found.  ROS  Constitutional: Constitutional: no fever, chills, night sweats, or significant weight loss.  Cardiovascular: Cardiovascular: no palpitations or chest pain.  Respiratory: Respiratory: no cough or shortness of breath and No COPD.  Gastrointestinal:  Gastrointestinal: no vomiting or nausea.  Musculoskeletal: Musculoskeletal: Joint Pain and swelling in Joints and back pain.  Neurologic: Neurologic: numbness and tingling/parasthesias and no difficulty with balance.  There were no vitals taken for this visit. Physical Exam  Constitutional: She is oriented to person, place, and time. She appears well-developed. She appears distressed.  HENT:  Head: Normocephalic.  Eyes: Pupils are equal, round, and reactive to light.  Neck: Normal range of motion.  Cardiovascular: Normal rate.  Respiratory: Effort normal.  GI: Soft.  Musculoskeletal:  Patient is a 61-year-old female.  Patient is awake, alert, and oriented 3. Obese. She is in acute distress, tearful, unable to find a comfortable position and changes positions often during her exam, alternates between standing and leaning over on the exam table versus sitting and leaning to the side with her right leg back underneath the chair.  On examination of the lumbar spine, tender to palpation through the spinous processes and right paraspinous musculature. Tender through the right buttock, hypersensitive over the upper outer buttock, tender over the greater trochanter of the right hip as well, nontender left buttock and hip. Decreased flexion and extension lumbar spine. Significant a positive straight leg raise on the right, reproduces back buttock and leg pain. Straight leg raise on the left with some back pain, no leg pain. EHL trace weakness on the left, 4/5 on the right, no other lower extremity weakness noted, 5/5 hip flexors, quads, hamstrings, plantar and dorsiflexion. No groin pain with rotation of the hips bilaterally. Patellar and Achilles reflexes 2+. No clonus present, negative Babinski. Sensation intact distally. No calf pain or sign of DVT.   Prior x-rays again reviewed, disc degeneration noted at L5-S1 with no listhesis or instability. L4-5 is maintained as a fairly normal disc space,  minimal degeneration. There is no listhesis.  MRI images and report reviewed today by Dr. Beane, there is a large facet cyst off of the L4-5 facet joint on the right which is displacing the L5 nerve.  Neurological: She is alert and oriented to person, place, and time.     Assessment/Plan 1. Pain in lumbar spine 2. Synovial cyst of lumbar spine 3. Lumbar radiculopathy Ongoing back and right lower extremity radicular symptoms with weakness and neural tension signs refractory to prednisone, anti-inflammatories, gabapentin, activity modifications and relative rest, MRI significant for a large facet cyst at L4-5 to the right causing displacement of the L5 root   Plan: We again discussed relevant anatomy and etiology of her symptoms in detail and using her MRI images and anatomic models. We again discussed the importance of activity modifications not only now while she is hurting but also going forward to prevent future injuries. We discussed treatment options at this point. Would recommend proceeding with microlumbar decompression at L4-5 and excision of facet cyst. We discussed the procedure itself as well   as risks, complications, alternatives including but not limited to DVT, PE, failure of procedure, need for secondary procedure, anesthesia risks and even death. Discussed postop protocols. We will proceed with this urgently given her level of pain, and to avoid any permanent weakness or nerve damage. All her questions were answered. She has no history of DVT or MRSA. She is unable to tolerate morphine but to her knowledge has tolerated Norco well. We will plan to use Norco now for pain control through the weekend and plan extra strength Norco postoperatively as long as she tolerates that well. I have also given her a prescription for Zofran in case she does experience nausea with that through the weekend. She could still try the gabapentin and go up on the dosage of that as well. We will plan to utilize  Vanco perioperatively for antibiotic, as she had hives and swelling as a reaction to penicillin as a child. All her questions were answered and she desires to proceed with surgery as above, we will proceed accordingly. Patient was seen in conjunction with Dr. Beane today.  Plan microlumbar decompression L4-5  Tona Qualley M., PA-C for Dr. Beane 12/11/2017, 1:01 PM   

## 2017-12-11 NOTE — H&P (Signed)
Kayla [name] is an 62 y.o. female.   Chief Complaint: back and R leg pain HPI: Reason for Visit: Diagnositc Results (lumbar spine)  Context: The patient is 6 1/2 months out from when symptoms began  Location (Lower Extremity): lower back pain ; leg pain on the right, , lateral Severity: pain level 9/10; severe  Timing: constant  Medications: The patient is currently taking Tylenol and gabapentin Notes: She follows up today accompanied by her husband to review her MRI. She reports the prednisone did not help with pain at all, but it did make her hyper and she gained 6 pounds while taking it. Gabapentin does not seem to be helping at all with pain. She reports ongoing pain radiating down the right leg in the L5 distribution with sharp stabbing pains especially in the lower leg. Her right leg is also numb and tingly and she does note that it feels weak with weightbearing. Her left leg is starting to feel tired and she attributes this to compensating for the right side. She is taking Tylenol for pain rright now since the gabapentin does not seem to be helping. At last visit she had wanted to avoid any narcotic medications because her nephew died of an overdose from narcotics. She reports that her symptoms really started around Thanksgiving, initially she thought it was about 6 months ago, but they have been significantly worse over the last month. She has found herself dragging her feet by the end of the day due to weakness. She is tearful today and feels that the pain is unbearable. Many times she has almost gone to the emergency room. He denies any bowel or bladder incontinence.  Past Medical History:  Diagnosis Date  . Arthritis   . GERD 11/11/2010  . Heart murmur    as a chld   . Hematemesis 11/11/2010  . Hypertension   . Hypothyroidism   . Obesity   . VASOVAGAL SYNCOPE 11/11/2010    Past Surgical History:  Procedure Laterality Date  . ABDOMINAL HYSTERECTOMY    . CHOLECYSTECTOMY    . KNEE  ARTHROSCOPY Left   . LAPAROSCOPIC GASTRIC BANDING    . LAPAROSCOPIC REPAIR AND REMOVAL OF GASTRIC BAND N/A 10/05/2013   Procedure: LAPAROSCOPIC REPAIR AND REMOVAL OF GASTRIC BAND;  Surgeon: Kandis Cocking, MD;  Location: [facility];  Service: General;  Laterality: N/A;  . 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 UPPER GI ENDOSCOPY  10/05/2013   Procedure: UPPER GI ENDOSCOPY;  Surgeon: Kandis Cocking, MD;  Location: [facility];  Service: General;;    Family History  Problem Relation Age of Onset  . Alzheimer's disease Mother   . Hypertension Mother   . Hypertension Father   . Dementia Father   . Other Father        stomcah surgery with ostomy bag  . Asthma Paternal Grandmother    Social History:  reports that  has never smoked. she has never used smokeless tobacco. She reports that she does not drink alcohol or use drugs.  Allergies:  Allergies  Allergen Reactions  . Dairy Aid [Lactase] Other (See Comments)    Diarrhea, vomiting, stomach cramps Patient reports she no longer has this as an intolerance   . Eggs Or Egg-Derived Products Diarrhea    Vomiting, stomach cramps Patient reports on 12/11/17 that she no longer has as an intolerance   . Gluten Meal Diarrhea    Vomiting, stomach cramps On 12/11/2017 patient  reports she no longer has as an intolerance   . Asa Arthritis Strength-Antacid [Aspirin Buffered] Hives and Swelling  . Cefaclor Hives  . Morphine Nausea And Vomiting  . Penicillins Other (See Comments)    Childhood allergy - severe reaction  . Tetracycline Hives     (Not in a hospital admission)  No results found for this or any previous visit (from the past 48 hour(s)). No results found.  ROS  Constitutional: Constitutional: no fever, chills, night sweats, or significant weight loss.  Cardiovascular: Cardiovascular: no palpitations or chest pain.  Respiratory: Respiratory: no cough or shortness of breath and No COPD.  Gastrointestinal:  Gastrointestinal: no vomiting or nausea.  Musculoskeletal: Musculoskeletal: Joint Pain and swelling in Joints and back pain.  Neurologic: Neurologic: numbness and tingling/parasthesias and no difficulty with balance.  There were no vitals taken for this visit. Physical Exam  Constitutional: She is oriented to person, place, and time. She appears well-developed. She appears distressed.  HENT:  Head: Normocephalic.  Eyes: Pupils are equal, round, and reactive to light.  Neck: Normal range of motion.  Cardiovascular: Normal rate.  Respiratory: Effort normal.  GI: Soft.  Musculoskeletal:  Patient is a 62 year old female.  Patient is awake, alert, and oriented 3. Obese. She is in acute distress, tearful, unable to find a comfortable position and changes positions often during her exam, alternates between standing and leaning over on the exam table versus sitting and leaning to the side with her right leg back underneath the chair.  On examination of the lumbar spine, tender to palpation through the spinous processes and right paraspinous musculature. Tender through the right buttock, hypersensitive over the upper outer buttock, tender over the greater trochanter of the right hip as well, nontender left buttock and hip. Decreased flexion and extension lumbar spine. Significant a positive straight leg raise on the right, reproduces back buttock and leg pain. Straight leg raise on the left with some back pain, no leg pain. EHL trace weakness on the left, 4/5 on the right, no other lower extremity weakness noted, 5/5 hip flexors, quads, hamstrings, plantar and dorsiflexion. No groin pain with rotation of the hips bilaterally. Patellar and Achilles reflexes 2+. No clonus present, negative Babinski. Sensation intact distally. No calf pain or sign of DVT.   Prior x-rays again reviewed, disc degeneration noted at L5-S1 with no listhesis or instability. L4-5 is maintained as a fairly normal disc space,  minimal degeneration. There is no listhesis.  MRI images and report reviewed today by Dr. Shelle IronBeane, there is a large facet cyst off of the L4-5 facet joint on the right which is displacing the L5 nerve.  Neurological: She is alert and oriented to person, place, and time.     Assessment/Plan 1. Pain in lumbar spine 2. Synovial cyst of lumbar spine 3. Lumbar radiculopathy Ongoing back and right lower extremity radicular symptoms with weakness and neural tension signs refractory to prednisone, anti-inflammatories, gabapentin, activity modifications and relative rest, MRI significant for a large facet cyst at L4-5 to the right causing displacement of the L5 root   Plan: We again discussed relevant anatomy and etiology of her symptoms in detail and using her MRI images and anatomic models. We again discussed the importance of activity modifications not only now while she is hurting but also going forward to prevent future injuries. We discussed treatment options at this point. Would recommend proceeding with microlumbar decompression at L4-5 and excision of facet cyst. We discussed the procedure itself as well  as risks, complications, alternatives including but not limited to DVT, PE, failure of procedure, need for secondary procedure, anesthesia risks and even death. Discussed postop protocols. We will proceed with this urgently given her level of pain, and to avoid any permanent weakness or nerve damage. All her questions were answered. She has no history of DVT or MRSA. She is unable to tolerate morphine but to her knowledge has tolerated Norco well. We will plan to use Norco now for pain control through the weekend and plan extra strength Norco postoperatively as long as she tolerates that well. I have also given her a prescription for Zofran in case she does experience nausea with that through the weekend. She could still try the gabapentin and go up on the dosage of that as well. We will plan to utilize  Vanco perioperatively for antibiotic, as she had hives and swelling as a reaction to penicillin as a child. All her questions were answered and she desires to proceed with surgery as above, we will proceed accordingly. Patient was seen in conjunction with Dr. Shelle Iron today.  Plan microlumbar decompression L4-5  Dorothy Spark., PA-C for Dr. Shelle Iron 12/11/2017, 1:01 PM

## 2017-12-13 MED ORDER — VANCOMYCIN HCL 10 G IV SOLR
1500.0000 mg | INTRAVENOUS | Status: AC
  Administered 2017-12-14: 1500 mg via INTRAVENOUS
  Filled 2017-12-13: qty 1500

## 2017-12-14 ENCOUNTER — Ambulatory Visit (HOSPITAL_COMMUNITY): Payer: BLUE CROSS/BLUE SHIELD

## 2017-12-14 ENCOUNTER — Ambulatory Visit (HOSPITAL_COMMUNITY): Payer: BLUE CROSS/BLUE SHIELD | Admitting: Certified Registered"

## 2017-12-14 ENCOUNTER — Ambulatory Visit (HOSPITAL_COMMUNITY)
Admission: RE | Admit: 2017-12-14 | Discharge: 2017-12-15 | Disposition: A | Discharge status: Home / Self Care | Payer: BLUE CROSS/BLUE SHIELD | Source: Ambulatory Visit | Attending: Specialist | Admitting: Specialist

## 2017-12-14 ENCOUNTER — Other Ambulatory Visit: Payer: Self-pay

## 2017-12-14 ENCOUNTER — Encounter (HOSPITAL_COMMUNITY): Payer: Self-pay | Admitting: Anesthesiology

## 2017-12-14 ENCOUNTER — Encounter (HOSPITAL_COMMUNITY)
Admission: RE | Disposition: A | Discharge status: Home / Self Care | Payer: Self-pay | Source: Ambulatory Visit | Attending: Specialist

## 2017-12-14 DIAGNOSIS — Z7984 Long term (current) use of oral hypoglycemic drugs: Secondary | ICD-10-CM | POA: Insufficient documentation

## 2017-12-14 DIAGNOSIS — Z885 Allergy status to narcotic agent status: Secondary | ICD-10-CM | POA: Diagnosis not present

## 2017-12-14 DIAGNOSIS — M48061 Spinal stenosis, lumbar region without neurogenic claudication: Secondary | ICD-10-CM | POA: Diagnosis not present

## 2017-12-14 DIAGNOSIS — Z881 Allergy status to other antibiotic agents status: Secondary | ICD-10-CM | POA: Diagnosis not present

## 2017-12-14 DIAGNOSIS — Z79899 Other long term (current) drug therapy: Secondary | ICD-10-CM | POA: Insufficient documentation

## 2017-12-14 DIAGNOSIS — E039 Hypothyroidism, unspecified: Secondary | ICD-10-CM | POA: Insufficient documentation

## 2017-12-14 DIAGNOSIS — M5416 Radiculopathy, lumbar region: Secondary | ICD-10-CM | POA: Diagnosis not present

## 2017-12-14 DIAGNOSIS — Z880 Allergy status to penicillin: Secondary | ICD-10-CM | POA: Diagnosis not present

## 2017-12-14 DIAGNOSIS — E119 Type 2 diabetes mellitus without complications: Secondary | ICD-10-CM | POA: Diagnosis not present

## 2017-12-14 DIAGNOSIS — K219 Gastro-esophageal reflux disease without esophagitis: Secondary | ICD-10-CM | POA: Diagnosis not present

## 2017-12-14 DIAGNOSIS — M48062 Spinal stenosis, lumbar region with neurogenic claudication: Secondary | ICD-10-CM | POA: Diagnosis not present

## 2017-12-14 DIAGNOSIS — Z6841 Body Mass Index (BMI) 40.0 and over, adult: Secondary | ICD-10-CM | POA: Insufficient documentation

## 2017-12-14 DIAGNOSIS — I10 Essential (primary) hypertension: Secondary | ICD-10-CM | POA: Insufficient documentation

## 2017-12-14 DIAGNOSIS — Z419 Encounter for procedure for purposes other than remedying health state, unspecified: Secondary | ICD-10-CM

## 2017-12-14 DIAGNOSIS — M7138 Other bursal cyst, other site: Secondary | ICD-10-CM | POA: Insufficient documentation

## 2017-12-14 DIAGNOSIS — M5126 Other intervertebral disc displacement, lumbar region: Secondary | ICD-10-CM

## 2017-12-14 DIAGNOSIS — M545 Low back pain: Secondary | ICD-10-CM | POA: Diagnosis not present

## 2017-12-14 DIAGNOSIS — Z886 Allergy status to analgesic agent status: Secondary | ICD-10-CM | POA: Insufficient documentation

## 2017-12-14 DIAGNOSIS — Z981 Arthrodesis status: Secondary | ICD-10-CM | POA: Diagnosis not present

## 2017-12-14 DIAGNOSIS — S34114A Complete lesion of L4 level of lumbar spinal cord, initial encounter: Secondary | ICD-10-CM | POA: Diagnosis not present

## 2017-12-14 HISTORY — DX: Hypothyroidism, unspecified: E03.9

## 2017-12-14 HISTORY — DX: Cardiac murmur, unspecified: R01.1

## 2017-12-14 HISTORY — DX: Essential (primary) hypertension: I10

## 2017-12-14 HISTORY — DX: Unspecified osteoarthritis, unspecified site: M19.90

## 2017-12-14 HISTORY — PX: LUMBAR LAMINECTOMY/DECOMPRESSION MICRODISCECTOMY: SHX5026

## 2017-12-14 LAB — BASIC METABOLIC PANEL
Anion gap: 11 (ref 5–15)
BUN: 24 mg/dL — ABNORMAL HIGH (ref 6–20)
CO2: 23 mmol/L (ref 22–32)
Calcium: 9.2 mg/dL (ref 8.9–10.3)
Chloride: 105 mmol/L (ref 101–111)
Creatinine, Ser: 0.83 mg/dL (ref 0.44–1.00)
GFR calc Af Amer: >60 mL/min (ref >60)
GFR calc non Af Amer: >60 mL/min (ref >60)
Glucose, Bld: 98 mg/dL (ref 65–99)
Potassium: 3.6 mmol/L (ref 3.5–5.1)
Sodium: 139 mmol/L (ref 135–145)

## 2017-12-14 LAB — SURGICAL PCR SCREEN
MRSA, PCR: NEGATIVE
Staphylococcus aureus: NEGATIVE

## 2017-12-14 LAB — CBC
HCT: 42.1 % (ref 36.0–46.0)
Hemoglobin: 13.9 g/dL (ref 12.0–15.0)
MCH: 30.3 pg (ref 26.0–34.0)
MCHC: 33 g/dL (ref 30.0–36.0)
MCV: 91.7 fL (ref 78.0–100.0)
Platelets: 327 10*3/uL (ref 150–400)
RBC: 4.59 MIL/uL (ref 3.87–5.11)
RDW: 15.2 % (ref 11.5–15.5)
WBC: 8.2 10*3/uL (ref 4.0–10.5)

## 2017-12-14 SURGERY — LUMBAR LAMINECTOMY/DECOMPRESSION MICRODISCECTOMY 1 LEVEL
Anesthesia: General

## 2017-12-14 MED ORDER — BISACODYL 5 MG PO TBEC
5.0000 mg | DELAYED_RELEASE_TABLET | Freq: Every day | ORAL | Status: DC | PRN
  Filled 2017-12-14: qty 1

## 2017-12-14 MED ORDER — METHOCARBAMOL 1000 MG/10ML IJ SOLN
500.0000 mg | Freq: Four times a day (QID) | INTRAVENOUS | Status: DC | PRN
  Administered 2017-12-14: 500 mg via INTRAVENOUS
  Filled 2017-12-14: qty 550

## 2017-12-14 MED ORDER — THROMBIN (RECOMBINANT) 5000 UNITS EX SOLR
OROMUCOSAL | Status: DC | PRN
  Administered 2017-12-14: 5000 mL via TOPICAL

## 2017-12-14 MED ORDER — ONDANSETRON HCL 4 MG PO TABS
4.0000 mg | ORAL_TABLET | Freq: Four times a day (QID) | ORAL | Status: DC | PRN
  Filled 2017-12-14: qty 1

## 2017-12-14 MED ORDER — MEPERIDINE HCL 50 MG/ML IJ SOLN: 6.2500 mg | INTRAMUSCULAR | Status: DC | PRN

## 2017-12-14 MED ORDER — BENAZEPRIL HCL 10 MG PO TABS: 20.0000 mg | ORAL_TABLET | Freq: Every day | ORAL | Status: DC

## 2017-12-14 MED ORDER — MENTHOL 3 MG MT LOZG
1.0000 | LOZENGE | OROMUCOSAL | Status: DC | PRN
  Filled 2017-12-14: qty 9

## 2017-12-14 MED ORDER — ALUM & MAG HYDROXIDE-SIMETH 200-200-20 MG/5ML PO SUSP
30.0000 mL | Freq: Four times a day (QID) | ORAL | Status: DC | PRN
  Filled 2017-12-14: qty 30

## 2017-12-14 MED ORDER — PHENOL 1.4 % MT LIQD
1.0000 | OROMUCOSAL | Status: DC | PRN
  Filled 2017-12-14: qty 177

## 2017-12-14 MED ORDER — ACETAMINOPHEN 10 MG/ML IV SOLN
INTRAVENOUS | Status: AC
  Filled 2017-12-14: qty 100

## 2017-12-14 MED ORDER — RISAQUAD PO CAPS: 1.0000 | ORAL_CAPSULE | Freq: Every day | ORAL | Status: DC

## 2017-12-14 MED ORDER — FENTANYL CITRATE (PF) 100 MCG/2ML IJ SOLN
INTRAMUSCULAR | Status: DC | PRN
  Administered 2017-12-14: 25 ug via INTRAVENOUS
  Administered 2017-12-14: 50 ug via INTRAVENOUS
  Administered 2017-12-14: 100 ug via INTRAVENOUS
  Administered 2017-12-14: 25 ug via INTRAVENOUS

## 2017-12-14 MED ORDER — LACTATED RINGERS IV SOLN
INTRAVENOUS | Status: DC | PRN
  Administered 2017-12-14: 06:00:00 via INTRAVENOUS

## 2017-12-14 MED ORDER — PROPOFOL 10 MG/ML IV BOLUS
INTRAVENOUS | Status: AC
  Filled 2017-12-14: qty 40

## 2017-12-14 MED ORDER — BUPIVACAINE-EPINEPHRINE 0.5% -1:200000 IJ SOLN
INTRAMUSCULAR | Status: DC | PRN
  Administered 2017-12-14: 13 mL

## 2017-12-14 MED ORDER — TRAMADOL HCL 50 MG PO TABS: 50.0000 mg | ORAL_TABLET | Freq: Four times a day (QID) | ORAL | Status: DC | PRN

## 2017-12-14 MED ORDER — LIDOCAINE 2% (20 MG/ML) 5 ML SYRINGE
INTRAMUSCULAR | Status: AC
  Filled 2017-12-14: qty 15

## 2017-12-14 MED ORDER — ONDANSETRON HCL 4 MG/2ML IJ SOLN
INTRAMUSCULAR | Status: AC
  Filled 2017-12-14: qty 6

## 2017-12-14 MED ORDER — SUGAMMADEX SODIUM 500 MG/5ML IV SOLN
INTRAVENOUS | Status: AC
  Filled 2017-12-14: qty 5

## 2017-12-14 MED ORDER — EPHEDRINE 5 MG/ML INJ
INTRAVENOUS | Status: AC
  Filled 2017-12-14: qty 10

## 2017-12-14 MED ORDER — PROGESTERONE MICRONIZED 100 MG PO CAPS
300.0000 mg | ORAL_CAPSULE | Freq: Every day | ORAL | Status: DC
  Administered 2017-12-14: 300 mg via ORAL
  Filled 2017-12-14: qty 3

## 2017-12-14 MED ORDER — ONDANSETRON HCL 4 MG/2ML IJ SOLN: 4.0000 mg | Freq: Four times a day (QID) | INTRAMUSCULAR | Status: DC | PRN

## 2017-12-14 MED ORDER — MIDAZOLAM HCL 2 MG/2ML IJ SOLN
INTRAMUSCULAR | Status: AC
  Filled 2017-12-14: qty 2

## 2017-12-14 MED ORDER — MIDAZOLAM HCL 5 MG/5ML IJ SOLN
INTRAMUSCULAR | Status: DC | PRN
  Administered 2017-12-14: 2 mg via INTRAVENOUS

## 2017-12-14 MED ORDER — OXYCODONE HCL 5 MG PO TABS
ORAL_TABLET | ORAL | Status: AC
  Filled 2017-12-14: qty 1

## 2017-12-14 MED ORDER — BENAZEPRIL-HYDROCHLOROTHIAZIDE 20-25 MG PO TABS: 1.0000 | ORAL_TABLET | Freq: Every day | ORAL | Status: DC

## 2017-12-14 MED ORDER — MAGNESIUM CITRATE PO SOLN
1.0000 | Freq: Once | ORAL | Status: DC | PRN
  Filled 2017-12-14: qty 296

## 2017-12-14 MED ORDER — FENTANYL CITRATE (PF) 100 MCG/2ML IJ SOLN
INTRAMUSCULAR | Status: AC
  Filled 2017-12-14: qty 2

## 2017-12-14 MED ORDER — SODIUM CHLORIDE 0.9 % IV SOLN
INTRAVENOUS | Status: AC
  Filled 2017-12-14: qty 500000

## 2017-12-14 MED ORDER — OXYCODONE HCL 5 MG/5ML PO SOLN
5.0000 mg | Freq: Once | ORAL | Status: AC | PRN
  Filled 2017-12-14: qty 5

## 2017-12-14 MED ORDER — HYDROMORPHONE HCL 1 MG/ML IJ SOLN
INTRAMUSCULAR | Status: AC
  Filled 2017-12-14: qty 1

## 2017-12-14 MED ORDER — HYDROCHLOROTHIAZIDE 25 MG PO TABS: 25.0000 mg | ORAL_TABLET | Freq: Every day | ORAL | Status: DC

## 2017-12-14 MED ORDER — DEXAMETHASONE SODIUM PHOSPHATE 10 MG/ML IJ SOLN
INTRAMUSCULAR | Status: AC
  Filled 2017-12-14: qty 3

## 2017-12-14 MED ORDER — LIDOCAINE 2% (20 MG/ML) 5 ML SYRINGE
INTRAMUSCULAR | Status: DC | PRN
  Administered 2017-12-14: 100 mg via INTRAVENOUS

## 2017-12-14 MED ORDER — METHOCARBAMOL 500 MG PO TABS
500.0000 mg | ORAL_TABLET | Freq: Four times a day (QID) | ORAL | Status: DC | PRN
  Administered 2017-12-14: 500 mg via ORAL
  Filled 2017-12-14: qty 1

## 2017-12-14 MED ORDER — OXYCODONE HCL 5 MG PO TABS
5.0000 mg | ORAL_TABLET | Freq: Once | ORAL | Status: AC | PRN
  Administered 2017-12-14: 5 mg via ORAL

## 2017-12-14 MED ORDER — ONDANSETRON HCL 4 MG/2ML IJ SOLN
INTRAMUSCULAR | Status: DC | PRN
  Administered 2017-12-14: 4 mg via INTRAVENOUS

## 2017-12-14 MED ORDER — HYDROMORPHONE HCL 1 MG/ML IJ SOLN
0.2500 mg | INTRAMUSCULAR | Status: DC | PRN
  Administered 2017-12-14 (×2): 0.25 mg via INTRAVENOUS

## 2017-12-14 MED ORDER — ACETAMINOPHEN 10 MG/ML IV SOLN
1000.0000 mg | INTRAVENOUS | Status: AC
  Administered 2017-12-14: 1000 mg via INTRAVENOUS
  Filled 2017-12-14: qty 100

## 2017-12-14 MED ORDER — SUGAMMADEX SODIUM 500 MG/5ML IV SOLN
INTRAVENOUS | Status: DC | PRN
  Administered 2017-12-14: 500 mg via INTRAVENOUS

## 2017-12-14 MED ORDER — THYROID 60 MG PO TABS
90.0000 mg | ORAL_TABLET | Freq: Two times a day (BID) | ORAL | Status: DC
  Administered 2017-12-14: 90 mg via ORAL
  Filled 2017-12-14 (×2): qty 1

## 2017-12-14 MED ORDER — VANCOMYCIN HCL 10 G IV SOLR
1500.0000 mg | Freq: Once | INTRAVENOUS | Status: AC
  Administered 2017-12-14: 1500 mg via INTRAVENOUS
  Filled 2017-12-14: qty 1500

## 2017-12-14 MED ORDER — PROPOFOL 10 MG/ML IV BOLUS
INTRAVENOUS | Status: DC | PRN
  Administered 2017-12-14: 160 mg via INTRAVENOUS

## 2017-12-14 MED ORDER — THROMBIN (RECOMBINANT) 5000 UNITS EX SOLR
CUTANEOUS | Status: AC
  Filled 2017-12-14: qty 10000

## 2017-12-14 MED ORDER — ACETAMINOPHEN 325 MG PO TABS: 650.0000 mg | ORAL_TABLET | ORAL | Status: DC | PRN

## 2017-12-14 MED ORDER — ACETAMINOPHEN 10 MG/ML IV SOLN
1000.0000 mg | Freq: Four times a day (QID) | INTRAVENOUS | Status: DC
  Administered 2017-12-14 – 2017-12-15 (×3): 1000 mg via INTRAVENOUS
  Filled 2017-12-14 (×3): qty 100

## 2017-12-14 MED ORDER — ROCURONIUM BROMIDE 10 MG/ML (PF) SYRINGE
PREFILLED_SYRINGE | INTRAVENOUS | Status: AC
  Filled 2017-12-14: qty 10

## 2017-12-14 MED ORDER — PROMETHAZINE HCL 25 MG/ML IJ SOLN: 6.2500 mg | INTRAMUSCULAR | Status: DC | PRN

## 2017-12-14 MED ORDER — POLYETHYLENE GLYCOL 3350 17 G PO PACK
17.0000 g | PACK | Freq: Every day | ORAL | Status: DC | PRN
  Filled 2017-12-14: qty 1

## 2017-12-14 MED ORDER — DOCUSATE SODIUM 100 MG PO CAPS
100.0000 mg | ORAL_CAPSULE | Freq: Two times a day (BID) | ORAL | Status: DC
  Administered 2017-12-14: 100 mg via ORAL
  Filled 2017-12-14: qty 1

## 2017-12-14 MED ORDER — SODIUM CHLORIDE 0.9 % IV SOLN
INTRAVENOUS | Status: DC | PRN
  Administered 2017-12-14: 500 mL

## 2017-12-14 MED ORDER — EPHEDRINE SULFATE-NACL 50-0.9 MG/10ML-% IV SOSY
PREFILLED_SYRINGE | INTRAVENOUS | Status: DC | PRN
  Administered 2017-12-14 (×3): 5 mg via INTRAVENOUS

## 2017-12-14 MED ORDER — KCL IN DEXTROSE-NACL 20-5-0.45 MEQ/L-%-% IV SOLN: INTRAVENOUS | Status: DC

## 2017-12-14 MED ORDER — DEXAMETHASONE SODIUM PHOSPHATE 10 MG/ML IJ SOLN
INTRAMUSCULAR | Status: DC | PRN
  Administered 2017-12-14: 10 mg via INTRAVENOUS

## 2017-12-14 MED ORDER — BUPIVACAINE-EPINEPHRINE 0.5% -1:200000 IJ SOLN
INTRAMUSCULAR | Status: AC
  Filled 2017-12-14: qty 1

## 2017-12-14 MED ORDER — ACETAMINOPHEN 650 MG RE SUPP
650.0000 mg | RECTAL | Status: DC | PRN
  Filled 2017-12-14: qty 1

## 2017-12-14 MED ORDER — LACTATED RINGERS IV SOLN: INTRAVENOUS | Status: DC

## 2017-12-14 MED ORDER — ROCURONIUM BROMIDE 10 MG/ML (PF) SYRINGE
PREFILLED_SYRINGE | INTRAVENOUS | Status: DC | PRN
  Administered 2017-12-14: 5 mg via INTRAVENOUS
  Administered 2017-12-14: 10 mg via INTRAVENOUS
  Administered 2017-12-14: 50 mg via INTRAVENOUS
  Administered 2017-12-14 (×3): 5 mg via INTRAVENOUS

## 2017-12-14 SURGICAL SUPPLY — 53 items
AGENT HMST SPONGE THK3/8 (HEMOSTASIS)
BAG SPEC THK2 15X12 ZIP CLS (MISCELLANEOUS)
BAG ZIPLOCK 12X15 (MISCELLANEOUS) IMPLANT
CLEANER TIP ELECTROSURG 2X2 (MISCELLANEOUS) ×2 IMPLANT
CLOTH 2% CHLOROHEXIDINE 3PK (PERSONAL CARE ITEMS) ×2 IMPLANT
COVER SURGICAL LIGHT HANDLE (MISCELLANEOUS) ×2 IMPLANT
DRAPE MICROSCOPE LEICA (MISCELLANEOUS) ×2 IMPLANT
DRAPE POUCH INSTRU U-SHP 10X18 (DRAPES) ×2 IMPLANT
DRAPE SHEET LG 3/4 BI-LAMINATE (DRAPES) ×2 IMPLANT
DRAPE SURG 17X11 SM STRL (DRAPES) ×2 IMPLANT
DRAPE UTILITY XL STRL (DRAPES) ×2 IMPLANT
DRSG AQUACEL AG ADV 3.5X 4 (GAUZE/BANDAGES/DRESSINGS) IMPLANT
DRSG AQUACEL AG ADV 3.5X 6 (GAUZE/BANDAGES/DRESSINGS) ×1 IMPLANT
DRSG TELFA 3X8 NADH (GAUZE/BANDAGES/DRESSINGS) IMPLANT
DURAPREP 26ML APPLICATOR (WOUND CARE) ×2 IMPLANT
DURASEAL SPINE SEALANT 3ML (MISCELLANEOUS) IMPLANT
ELECT BLADE TIP CTD 4 INCH (ELECTRODE) ×1 IMPLANT
ELECT REM PT RETURN 15FT ADLT (MISCELLANEOUS) ×2 IMPLANT
GLOVE BIOGEL PI IND STRL 7.0 (GLOVE) ×1 IMPLANT
GLOVE BIOGEL PI INDICATOR 7.0 (GLOVE) ×1
GLOVE SURG SS PI 7.0 STRL IVOR (GLOVE) ×2 IMPLANT
GLOVE SURG SS PI 7.5 STRL IVOR (GLOVE) ×2 IMPLANT
GLOVE SURG SS PI 8.0 STRL IVOR (GLOVE) ×4 IMPLANT
GOWN STRL REUS W/TWL XL LVL3 (GOWN DISPOSABLE) ×4 IMPLANT
HEMOSTAT SPONGE AVITENE ULTRA (HEMOSTASIS) IMPLANT
IV CATH 14GX2 1/4 (CATHETERS) ×2 IMPLANT
KIT BASIN OR (CUSTOM PROCEDURE TRAY) ×2 IMPLANT
KIT POSITIONING SURG ANDREWS (MISCELLANEOUS) ×2 IMPLANT
MANIFOLD NEPTUNE II (INSTRUMENTS) ×2 IMPLANT
NDL SPNL 18GX3.5 QUINCKE PK (NEEDLE) ×2 IMPLANT
NEEDLE SPNL 18GX3.5 QUINCKE PK (NEEDLE) ×4 IMPLANT
PACK LAMINECTOMY ORTHO (CUSTOM PROCEDURE TRAY) ×2 IMPLANT
PAD DRESSING TELFA 3X8 NADH (GAUZE/BANDAGES/DRESSINGS) IMPLANT
PATTIES SURGICAL .5 X.5 (GAUZE/BANDAGES/DRESSINGS) IMPLANT
PATTIES SURGICAL .75X.75 (GAUZE/BANDAGES/DRESSINGS) ×1 IMPLANT
PATTIES SURGICAL 1X1 (DISPOSABLE) IMPLANT
RUBBERBAND STERILE (MISCELLANEOUS) ×4 IMPLANT
SPONGE SURGIFOAM ABS GEL 100 (HEMOSTASIS) ×2 IMPLANT
STAPLER VISISTAT (STAPLE) IMPLANT
STRIP CLOSURE SKIN 1/2X4 (GAUZE/BANDAGES/DRESSINGS) ×2 IMPLANT
SUT NURALON 4 0 TR CR/8 (SUTURE) IMPLANT
SUT PROLENE 3 0 PS 2 (SUTURE) ×2 IMPLANT
SUT STRATAFIX 1PDS 45CM VIOLET (SUTURE) ×1 IMPLANT
SUT VIC AB 1 CT1 27 (SUTURE) ×2
SUT VIC AB 1 CT1 27XBRD ANTBC (SUTURE) IMPLANT
SUT VIC AB 1-0 CT2 27 (SUTURE) ×2 IMPLANT
SUT VIC AB 2-0 CT1 27 (SUTURE)
SUT VIC AB 2-0 CT1 TAPERPNT 27 (SUTURE) IMPLANT
SUT VIC AB 2-0 CT2 27 (SUTURE) ×2 IMPLANT
SYR 3ML LL SCALE MARK (SYRINGE) IMPLANT
TOWEL OR 17X26 10 PK STRL BLUE (TOWEL DISPOSABLE) ×2 IMPLANT
TOWEL OR NON WOVEN STRL DISP B (DISPOSABLE) IMPLANT
YANKAUER SUCT BULB TIP NO VENT (SUCTIONS) IMPLANT

## 2017-12-14 NOTE — Progress Notes (Signed)
PACU Nursing Note: pt cont to hold for bed on ortho unit. Pt provided meals, comfort measures in place, service recovery done. Husband and pt very understanding. Keeping family and pt up to date on room status. Pt alert and oriented, denies any pain at this time, up several times to restroom and to ambulate in hallway in PACU, gait very steady.

## 2017-12-14 NOTE — Interval H&P Note (Signed)
History and Physical Interval Note:  12/14/2017 7:03 AM  Kayla [name]  has presented today for surgery, with the diagnosis of Facet Cyst L4-5  The various methods of treatment have been discussed with the patient and family. After consideration of risks, benefits and other options for treatment, the patient has consented to  Procedure(s) with comments: Microlumbar decompression L4-5, excision of facet cyst (N/A) - 120 mins as a surgical intervention .  The patient's history has been reviewed, patient examined, no change in status, stable for surgery.  I have reviewed the patient's chart and labs.  Questions were answered to the patient's satisfaction.     Owain Eckerman C

## 2017-12-14 NOTE — Anesthesia Preprocedure Evaluation (Signed)
Anesthesia Evaluation  Patient identified by MRN, date of birth, ID band Patient awake    Reviewed: Allergy & Precautions, H&P , NPO status , Patient's Chart, lab work & pertinent test results  Airway Mallampati: II  TM Distance: >3 FB Neck ROM: Full    Dental no notable dental hx. (+) Dental Advisory Given, Teeth Intact   Pulmonary neg pulmonary ROS,    Pulmonary exam normal breath sounds clear to auscultation- rhonchi       Cardiovascular hypertension, Pt. on medications Normal cardiovascular exam Rhythm:Regular Rate:Normal     Neuro/Psych negative neurological ROS  negative psych ROS   GI/Hepatic Neg liver ROS, GERD  Medicated,  Endo/Other  diabetes, Type 2, Oral Hypoglycemic AgentsMorbid obesity  Renal/GU negative Renal ROS     Musculoskeletal negative musculoskeletal ROS (+)   Abdominal (+) + obese,   Peds  Hematology negative hematology ROS (+)   Anesthesia Other Findings   Reproductive/Obstetrics negative OB ROS                             Anesthesia Physical  Anesthesia Plan  ASA: III  Anesthesia Plan: General   Post-op Pain Management:    Induction: Intravenous  PONV Risk Score and Plan: 3  Airway Management Planned: Oral ETT  Additional Equipment:   Intra-op Plan:   Post-operative Plan: Extubation in OR  Informed Consent: I have reviewed the patients History and Physical, chart, labs and discussed the procedure including the risks, benefits and alternatives for the proposed anesthesia with the patient or authorized representative who has indicated his/her understanding and acceptance.   Dental advisory given  Plan Discussed with: CRNA  Anesthesia Plan Comments:         Anesthesia Quick Evaluation

## 2017-12-14 NOTE — Brief Op Note (Signed)
12/14/2017  9:06 AM  PATIENT:  Kayla [name]  62 y.o. female  PRE-OPERATIVE DIAGNOSIS:  Facet Cyst L4-5  POST-OPERATIVE DIAGNOSIS:  Facet Cyst L4-5  PROCEDURE:  Procedure(s) with comments: Microlumbar decompression L4-5, excision of facet cyst (N/A) - 120 mins  SURGEON:  Surgeon(s) and Role:    Jene Every* Felicidad Sugarman, MD - Primary  PHYSICIAN ASSISTANT:   ASSISTANTS: Bissell   ANESTHESIA:   general  EBL:  50 mL   BLOOD ADMINISTERED:none  DRAINS: none   LOCAL MEDICATIONS USED:  MARCAINE     SPECIMEN:  No Specimen  DISPOSITION OF SPECIMEN:  N/A  COUNTS:  YES  TOURNIQUET:  * No tourniquets in log *  DICTATION: .Other Dictation: Dictation Number [id number]  PLAN OF CARE: Admit for overnight observation  PATIENT DISPOSITION:  PACU - hemodynamically stable.   Delay start of Pharmacological VTE agent (>24hrs) due to surgical blood loss or risk of bleeding: yes

## 2017-12-14 NOTE — Discharge Instructions (Signed)

## 2017-12-14 NOTE — Progress Notes (Signed)
Report received from Casimiro NeedleMichael, RN and pt placed in HOLDING Status at this time. VS and orders assessed and pt is alert, talkative, pleasant and calm and has no s/s of distress. Will continue to monitor and tx pt according to MD orders.

## 2017-12-14 NOTE — Transfer of Care (Signed)
Immediate Anesthesia Transfer of Care Note  Patient: Marisue BrooklynDebra P Speranza  Procedure(s) Performed: Microlumbar decompression L4-5, excision of facet cyst (N/A )  Patient Location: PACU  Anesthesia Type:General  Level of Consciousness: awake, alert  and oriented  Airway & Oxygen Therapy: Patient Spontanous Breathing and Patient connected to face mask oxygen  Post-op Assessment: Report given to RN  Post vital signs: Reviewed and stable  Last Vitals:  Vitals:   12/14/17 0545  BP: 118/72  Pulse: 73  Resp: 16  Temp: 36.6 C  SpO2: 94%    Last Pain:  Vitals:   12/14/17 0637  TempSrc:   PainSc: 7       Patients Stated Pain Goal: 5 (12/14/17 [id number])  Complications: No apparent anesthesia complications

## 2017-12-14 NOTE — Anesthesia Procedure Notes (Signed)
Procedure Name: Intubation Date/Time: 12/14/2017 7:15 AM Performed by: Lavina Hamman, CRNA Pre-anesthesia Checklist: Patient identified, Emergency Drugs available, Suction available, Patient being monitored and Timeout performed Patient Re-evaluated:Patient Re-evaluated prior to induction Oxygen Delivery Method: Circle system utilized Preoxygenation: Pre-oxygenation with 100% oxygen Induction Type: IV induction Ventilation: Mask ventilation without difficulty Laryngoscope Size: Mac and 4 Grade View: Grade I Tube type: Oral Tube size: 7.5 mm Number of attempts: 1 Airway Equipment and Method: Stylet Placement Confirmation: ETT inserted through vocal cords under direct vision,  positive ETCO2,  CO2 detector and breath sounds checked- equal and bilateral Secured at: 22 cm Tube secured with: Tape Dental Injury: Teeth and Oropharynx as per pre-operative assessment

## 2017-12-14 NOTE — Anesthesia Postprocedure Evaluation (Signed)
Anesthesia Post Note  Patient: [name]  Procedure(s) Performed: Microlumbar decompression L4-5, excision of facet cyst (N/A )     Patient location during evaluation: PACU Anesthesia Type: General Level of consciousness: awake and alert Pain management: pain level controlled Vital Signs Assessment: post-procedure vital signs reviewed and stable Respiratory status: spontaneous breathing, nonlabored ventilation and respiratory function stable Cardiovascular status: blood pressure returned to baseline and stable Postop Assessment: no apparent nausea or vomiting Anesthetic complications: no    Last Vitals:  Vitals:   12/14/17 1000 12/14/17 1015  BP: 109/80 134/69  Pulse: 92 93  Resp: 15 19  Temp:    SpO2: 97% 96%    Last Pain:  Vitals:   12/14/17 1015  TempSrc:   PainSc: 3                  Lowella CurbWarren Ray Smrithi Pigford

## 2017-12-14 NOTE — Progress Notes (Signed)
Pharmacy Note:  Initial antibiotic(s) regimen of Vancomycin for Surgical prophylaxis   Comment: for 1 dose 12 hours post-op unless patient has a drain, then continue vancomycin until discontinued by physician.  .  Estimated Creatinine Clearance: 108.4 mL/min (by C-G formula based on SCr of 0.83 mg/dL).   Allergies  Allergen Reactions  . Penicillins Other (See Comments)    Childhood allergy - severe reaction Has patient had a PCN reaction causing immediate rash, facial/tongue/throat swelling, SOB or lightheadedness with hypotension: yes Has patient had a PCN reaction causing severe rash involving mucus membranes or skin necrosis: no Has patient had a PCN reaction that required hospitalization: yes Has patient had a PCN reaction occurring within the last 10 years: no If all of the above answers are "NO", then may proceed with Cephalosporin use.   Jonne Ply. Asa Arthritis Strength-Antacid [Aspirin Buffered] Hives and Swelling  . Cefaclor Hives  . Morphine Nausea And Vomiting  . Tetracycline Hives  . Vicodin [Hydrocodone-Acetaminophen] Rash    Rash over face, rash and itching over arms. Pt can take tylenol with no problem.    Vitals:   12/14/17 1000 12/14/17 1015  BP: 109/80 134/69  Pulse: 92 93  Resp: 15 19  Temp:    SpO2: 97% 96%    Anti-infectives (From admission, onward)   Start     Dose/Rate Route Frequency Ordered Stop   12/14/17 1830  vancomycin (VANCOCIN) 1,500 mg in sodium chloride 0.9 % 500 mL IVPB     1,500 mg 250 mL/hr over 120 Minutes Intravenous  Once 12/14/17 1029     12/14/17 0811  polymyxin B 500,000 Units, bacitracin 50,000 Units in sodium chloride 0.9 % 500 mL irrigation  Status:  Discontinued       As needed 12/14/17 0811 12/14/17 0925   12/14/17 0600  vancomycin (VANCOCIN) 1,500 mg in sodium chloride 0.9 % 500 mL IVPB     1,500 mg 250 mL/hr over 120 Minutes Intravenous On call to O.R. 12/13/17 1345 12/14/17 0745      Plan: One dose(s) of Vancomycin 1500mg   ordered 12 hr from pre-op Vancomycin No drain placed per Post-op note   Otho BellowsGreen, Kimia Finan L, [facility] HospitalRPH 12/14/2017 10:30 AM

## 2017-12-15 ENCOUNTER — Encounter (HOSPITAL_COMMUNITY): Payer: Self-pay | Admitting: Specialist

## 2017-12-15 DIAGNOSIS — E039 Hypothyroidism, unspecified: Secondary | ICD-10-CM | POA: Diagnosis not present

## 2017-12-15 DIAGNOSIS — Z6841 Body Mass Index (BMI) 40.0 and over, adult: Secondary | ICD-10-CM | POA: Diagnosis not present

## 2017-12-15 DIAGNOSIS — Z880 Allergy status to penicillin: Secondary | ICD-10-CM | POA: Diagnosis not present

## 2017-12-15 DIAGNOSIS — Z7984 Long term (current) use of oral hypoglycemic drugs: Secondary | ICD-10-CM | POA: Diagnosis not present

## 2017-12-15 DIAGNOSIS — M48061 Spinal stenosis, lumbar region without neurogenic claudication: Secondary | ICD-10-CM | POA: Diagnosis not present

## 2017-12-15 DIAGNOSIS — Z886 Allergy status to analgesic agent status: Secondary | ICD-10-CM | POA: Diagnosis not present

## 2017-12-15 DIAGNOSIS — M5416 Radiculopathy, lumbar region: Secondary | ICD-10-CM | POA: Diagnosis not present

## 2017-12-15 DIAGNOSIS — Z885 Allergy status to narcotic agent status: Secondary | ICD-10-CM | POA: Diagnosis not present

## 2017-12-15 DIAGNOSIS — I10 Essential (primary) hypertension: Secondary | ICD-10-CM | POA: Diagnosis not present

## 2017-12-15 DIAGNOSIS — K219 Gastro-esophageal reflux disease without esophagitis: Secondary | ICD-10-CM | POA: Diagnosis not present

## 2017-12-15 DIAGNOSIS — Z79899 Other long term (current) drug therapy: Secondary | ICD-10-CM | POA: Diagnosis not present

## 2017-12-15 DIAGNOSIS — M7138 Other bursal cyst, other site: Secondary | ICD-10-CM | POA: Diagnosis not present

## 2017-12-15 DIAGNOSIS — Z881 Allergy status to other antibiotic agents status: Secondary | ICD-10-CM | POA: Diagnosis not present

## 2017-12-15 DIAGNOSIS — E119 Type 2 diabetes mellitus without complications: Secondary | ICD-10-CM | POA: Diagnosis not present

## 2017-12-15 LAB — BASIC METABOLIC PANEL
Anion gap: 10 (ref 5–15)
BUN: 15 mg/dL (ref 6–20)
CO2: 21 mmol/L — ABNORMAL LOW (ref 22–32)
Calcium: 8.9 mg/dL (ref 8.9–10.3)
Chloride: 108 mmol/L (ref 101–111)
Creatinine, Ser: 0.7 mg/dL (ref 0.44–1.00)
GFR calc Af Amer: >60 mL/min (ref >60)
GFR calc non Af Amer: >60 mL/min (ref >60)
Glucose, Bld: 103 mg/dL — ABNORMAL HIGH (ref 65–99)
Potassium: 3.4 mmol/L — ABNORMAL LOW (ref 3.5–5.1)
Sodium: 139 mmol/L (ref 135–145)

## 2017-12-15 MED ORDER — TRAMADOL HCL 50 MG PO TABS: 50.0000 mg | ORAL_TABLET | Freq: Four times a day (QID) | ORAL | 0 refills | Status: DC | PRN

## 2017-12-15 MED ORDER — MAGNESIUM CITRATE PO SOLN: 1.0000 | Freq: Once | ORAL | Status: DC

## 2017-12-15 MED ORDER — METHOCARBAMOL 500 MG PO TABS: 500.0000 mg | ORAL_TABLET | Freq: Four times a day (QID) | ORAL | 1 refills | Status: DC | PRN

## 2017-12-15 MED ORDER — SODIUM CHLORIDE 0.9 % IV SOLN
INTRAVENOUS | Status: DC
  Administered 2017-12-15: 03:00:00 via INTRAVENOUS

## 2017-12-15 MED ORDER — DOCUSATE SODIUM 100 MG PO CAPS: 100.0000 mg | ORAL_CAPSULE | Freq: Two times a day (BID) | ORAL | 1 refills | Status: DC

## 2017-12-15 MED ORDER — POLYETHYLENE GLYCOL 3350 17 G PO PACK: 17.0000 g | PACK | Freq: Every day | ORAL | 0 refills | Status: DC

## 2017-12-15 NOTE — Progress Notes (Signed)
RN reviewed discharge instructions with patient and family. All questions answered.   Paperwork and prescriptions given to patient.   NT rolled patient down with all belongings to family car.  

## 2017-12-15 NOTE — Progress Notes (Signed)
Subjective: 1 Day Post-Op Procedure(s) (LRB): Microlumbar decompression L4-5, excision of facet cyst (N/A) Patient reports pain as moderate.  Still some soreness lat R lower leg otherwise leg pain is resolved. Noting incisional pain. Last BM Friday and normally has 2-3 per day. Noting some discomfort from that and wants to avoid hemorrhoids.  Objective: Vital signs in last 24 hours: Temp:  [98 F (36.7 C)-98.4 F (36.9 C)] 98.1 F (36.7 C) (02/26 0046) Pulse Rate:  [78-102] 79 (02/26 0046) Resp:  [10-19] 15 (02/26 0530) BP: (109-135)/(53-81) 118/62 (02/26 0530) SpO2:  [91 %-99 %] 93 % (02/26 0530)  Intake/Output from previous day: 02/25 0701 - 02/26 0700 In: 2467.3 [P.O.:1349; I.V.:918.3; IV Piggyback:200] Out: 900 [Urine:850; Blood:50] Intake/Output this shift: No intake/output data recorded.  Recent Labs    12/14/17 0635  HGB 13.9   Recent Labs    12/14/17 0635  WBC 8.2  RBC 4.59  HCT 42.1  PLT 327   Recent Labs    12/14/17 0635 12/15/17 0550  NA 139 139  K 3.6 3.4*  CL 105 108  CO2 23 21*  BUN 24* 15  CREATININE 0.83 0.70  GLUCOSE 98 103*  CALCIUM 9.2 8.9   No results for input(s): LABPT, INR in the last 72 hours.  Neurologically intact ABD soft Neurovascular intact Sensation intact distally Intact pulses distally Dorsiflexion/Plantar flexion intact Incision: dressing C/D/I and no drainage No cellulitis present Compartment soft no calf pain or sign of DVT  Assessment/Plan: 1 Day Post-Op Procedure(s) (LRB): Microlumbar decompression L4-5, excision of facet cyst (N/A) Advance diet Up with therapy D/C IV fluids  Mag citrate Stool softener Discussed D/C instructions, Lspine precautions, dressing instructions D/C home today after seen by therapy  BISSELL, JACLYN M. 12/15/2017, 8:24 AM

## 2017-12-15 NOTE — Op Note (Signed)
NAMArvid Right:  [name], DEBORAH               ACCOUNT NO.:  [id number]  MEDICAL RECORD NO.:  [id number]  LOCATION:                                 FACILITY:  PHYSICIAN:  Jene EveryJeffrey Diallo Ponder, M.D.         DATE OF BIRTH:  DATE OF PROCEDURE:  12/14/2017 DATE OF DISCHARGE:                              OPERATIVE REPORT   PREOPERATIVE DIAGNOSES:  Spinal stenosis, synovial cyst, L4-5 right; elevated body mass index.  POSTOPERATIVE DIAGNOSIS:  Spinal stenosis, synovial cyst, L4-5 right.  PROCEDURE PERFORMED: 1. Microlumbar decompression, L4-5, right. 2. Foraminotomy, L4-L5, right. 3. Excision of synovial cyst, 4-5, right.  ANESTHESIA:  General.  ASSISTANT:  Lanna PocheJacqueline Bissell, PA.  Technical difficulty increased due to the patient's elevated BMI of 42.  HISTORY:  This is a pleasant 62 year old female who has had right lower extremity radicular pain, severe, secondary to a large synovial cyst compressing the L5 nerve root, neural tension signs of EHL, weakness, refractory to conservative treatment, MRI indicating spinal stenosis and large synovial cyst compressing the L5 nerve root.  She was indicated for microlumbar decompression at L4-5 and excision of synovial cyst. Risks and benefits discussed including bleeding, infection, damage to the neurovascular structures, no change in symptoms, worsening symptoms, DVT, PE, anesthetic complications, etc.  TECHNIQUE:  With the patient in supine position after induction of adequate general anesthesia, 1.5 g of vancomycin due to allergy to PENICILLIN, she was placed prone on the [place] frame.  All bony prominences were well padded.  Lumbar region was prepped and draped in usual sterile fashion.  Two 18-gauge spinal needles were utilized to localize 4-5 interspace, confirmed with x-ray.  An incision was made from the spinous process of 4-5.  Subcutaneous tissue was dissected. Electrocautery was used to achieve hemostasis.  She had ample subcutaneous  adipose layer.  Dorsolumbar fascia was divided in line with skin incision.  Prior to that, 0.25% Marcaine with epinephrine was infiltrated in paraspinous musculature.  McCulloch retractor was placed. The deep retractors were utilized.  The extra long set was then secured. Operating microscope was draped and brought onto the surgical field. Confirmatory radiograph obtained.  First, hemilaminotomy of the caudad edge of L4 was performed with 2 and 3 mm Kerrison preserving the pars. A curette was utilized to detach the ligamentum flavum from the cephalad edge of 5.  Hypertrophic facet was noted.  Neuro patty placed beneath the ligamentum flavum.  A generous foraminotomy of L5 was performed.  We then removed a small portion of medial aspect of the inferior articulating process of 4.  Identified the superior articulating process of 5, skeletonized this.  Developed the plane between the facet and the underlying neural structures, with a Facilities manager[employer]son retractor.  Again, generous foraminotomy was performed, decompressed lateral recess to the medial border of the pedicle.  Identified the 5 root.  Noted immediately was copious portions of clear synovial fluid consistent with that seen in the synovial cyst.  This decompressed the cyst.  I continued cephalad resecting a small portion of the superior articulating process of 5.  We developed a plane between the cyst and the facet.  Foraminotomy of 4 was performed protecting the  4 root.  Hypertrophic ligamentum flavum was removed from the interspace as well.  We then developed a plane gently and meticulously between the L5 nerve root and the lining of the cyst and extensive epidural venous plexus, which was lysed as well.  We excised a portion of the cyst.  Portion of that was adhered to the lateral aspect of the thecal sac proximally.  We left that to avoid inadvertent durotomy.  We continued cephalad.  I was able then place the [facility] retractor cephalad  above the pedicle of 4, below the pedicle of 5 out the foramen of 4.  Good excursion of the 5 root and it was well decompressed.  Copiously irrigated.  There was no disk herniation noted. Bipolar electrocautery was utilized to achieve hemostasis.  Confirmatory radiograph obtained with [facility] retractor in the foramen of 4 and 5. Copiously irrigated.  Inspection revealed no CSF leakage or active bleeding.  We removed the McCulloch retractor and placed thrombin-soaked Gelfoam in the laminotomy defect.  We closed the dorsolumbar fascia with #1 Vicryl, subcu with multiple layers of 2-0, and skin with staples due to the patient's elevated BMI.  Sterile dressing applied.  Placed supine on the hospital bed, extubated without difficulty and transported to the recovery room in satisfactory condition.  The patient tolerated the procedure well.  No complications.  Minimal blood loss.     Jene Every, M.D.     Cordelia Pen  D:  12/14/2017  T:  12/14/2017  Job:  [id number]

## 2017-12-15 NOTE — Evaluation (Signed)
Physical Therapy One Time Evaluation Patient Details Name: Kayla ConstableDebra P Yehle MRN: [id number] DOB: 1956-05-03 Today's Date: 12/15/2017   History of Present Illness  Pt is a 62 y/o female s/p Microlumbar decompression L4-5, excision of facet cyst (N/A).   Clinical Impression  Patient evaluated by Physical Therapy with no further acute PT needs identified. All education has been completed and the patient has no further questions.  Reviewed back precautions and maintaining precautions during mobility.  Pt mobilizing well and denies increase in pain.  Pt feels ready for d/c home today. See below for any follow-up Physical Therapy or equipment needs. PT is signing off. Thank you for this referral.     Follow Up Recommendations No PT follow up    Equipment Recommendations  None recommended by PT    Recommendations for Other Services       Precautions / Restrictions Precautions Precautions: Back Precaution Booklet Issued: Yes (comment) Precaution Comments: pt able to recall back precautions, reviewed sleeping positions, log roll technique, and maintaining precautions with mobility Restrictions Weight Bearing Restrictions: No      Mobility  Bed Mobility Overal bed mobility: Needs Assistance Bed Mobility: Rolling;Sidelying to Sit Rolling: Min guard Sidelying to sit: Min guard       General bed mobility comments: verbally reviewed log roll technique  Transfers Overall transfer level: Needs assistance Equipment used: Rolling walker (2 wheeled) Transfers: Sit to/from Stand Sit to Stand: Supervision         General transfer comment: maintains precautions well, pt performed lower body dressing  Ambulation/Gait Ambulation/Gait assistance: Min guard;Supervision Ambulation Distance (Feet): 300 Feet Assistive device: None Gait Pattern/deviations: Step-through pattern     General Gait Details: no LOB or significant deviations observed  Stairs            Wheelchair  Mobility    Modified Rankin (Stroke Patients Only)       Balance Overall balance assessment: Mild deficits observed, not formally tested                                           Pertinent Vitals/Pain Pain Assessment: No/denies pain    Home Living Family/patient expects to be discharged to:: Private residence Living Arrangements: Spouse/significant other Available Help at Discharge: Family Type of Home: House Home Access: Level entry     Home Layout: One level Home Equipment: Shower seat - built Scientist, clinical (histocompatibility and immunogenetics)in;Adaptive equipment      Prior Function Level of Independence: Independent               Hand Dominance        Extremity/Trunk Assessment   Upper Extremity Assessment Upper Extremity Assessment: Overall WFL for tasks assessed    Lower Extremity Assessment Lower Extremity Assessment: Overall WFL for tasks assessed(reports preop symptoms of radiating R LE numbness, not present post op)       Communication   Communication: No difficulties  Cognition Arousal/Alertness: Awake/alert Behavior During Therapy: WFL for tasks assessed/performed Overall Cognitive Status: Within Functional Limits for tasks assessed                                        General Comments      Exercises     Assessment/Plan    PT Assessment Patent does not need any further PT  services  PT Problem List         PT Treatment Interventions      PT Goals (Current goals can be found in the Care Plan section)  Acute Rehab PT Goals Patient Stated Goal: return home today  PT Goal Formulation: All assessment and education complete, DC therapy    Frequency     Barriers to discharge        Co-evaluation               AM-PAC PT "6 Clicks" Daily Activity  Outcome Measure Difficulty turning over in bed (including adjusting bedclothes, sheets and blankets)?: None Difficulty moving from lying on back to sitting on the side of the bed? :  None Difficulty sitting down on and standing up from a chair with arms (e.g., wheelchair, bedside commode, etc,.)?: None Help needed moving to and from a bed to chair (including a wheelchair)?: None Help needed walking in hospital room?: None Help needed climbing 3-5 steps with a railing? : None 6 Click Score: 24    End of Session   Activity Tolerance: Patient tolerated treatment well Patient left: in chair;with family/visitor present;with call bell/phone within reach Nurse Communication: Mobility status PT Visit Diagnosis: Difficulty in walking, not elsewhere classified (R26.2)    Time: 1610-9604 PT Time Calculation (min) (ACUTE ONLY): 19 min   Charges:   PT Evaluation $PT Eval Low Complexity: 1 Low     PT G CodesZenovia Jarred, PT, DPT 12/15/2017 Pager: [phone]  Maida Sale E 12/15/2017, 1:21 PM

## 2017-12-15 NOTE — Evaluation (Signed)
Occupational Therapy Evaluation Patient Details Name: Kayla [name] MRN: [id number] DOB: 12-14-1955 Today's Date: 12/15/2017    History of Present Illness Pt is a 62 y/o female s/p Microlumbar decompression L4-5, excision of facet cyst (N/A).    Clinical Impression   This 62 y/o F presents with the above. At baseline Pt is independent with ADLs and functional mobility. Pt completing functional mobility with supervision this session, requires minA-MinGuard for LB ADLs secondary to adhering to back precautions. Education provided on AE, safety and compensatory techniques for completing ADLs while adhering to precautions, pt requiring min verbal cues this session for adherence to precautions during task completion. Pt reports will return home with spouse who is available to assist PRN. Questions answered throughout; pt reports feeling comfortable completing ADLs and functional mobility after return home with available spouse assist. No further acute OT needs identified at this time. Will sign off.     Follow Up Recommendations  No OT follow up;Supervision - Intermittent    Equipment Recommendations  None recommended by OT           Precautions / Restrictions Precautions Precautions: Back Precaution Booklet Issued: Yes (comment) Precaution Comments: issued and reviewed with pt  Restrictions Weight Bearing Restrictions: No      Mobility Bed Mobility Overal bed mobility: Needs Assistance Bed Mobility: Rolling;Sidelying to Sit Rolling: Min guard Sidelying to sit: Min guard       General bed mobility comments: minguard for safety; verbal cues for log roll technique   Transfers Overall transfer level: Needs assistance   Transfers: Sit to/from Stand Sit to Stand: Supervision         General transfer comment: supervision for safety     Balance Overall balance assessment: Mild deficits observed, not formally tested                                          ADL either performed or assessed with clinical judgement   ADL Overall ADL's : Needs assistance/impaired Eating/Feeding: Modified independent;Sitting   Grooming: Min guard;Standing;Oral care;Cueing for compensatory techniques   Upper Body Bathing: Sitting;Supervision/ safety   Lower Body Bathing: Min guard;Sit to/from stand   Upper Body Dressing : Supervision/safety;Sitting   Lower Body Dressing: Min guard;Sit to/from stand Lower Body Dressing Details (indicate cue type and reason): pt able to complete figure 4 technique without difficulty or strain to back; also educated pt on use of reacher for LB dressing with pt return demonstrating understanding Toilet Transfer: Supervision/safety;Ambulation;Regular Social workerToilet   Toileting- Clothing Manipulation and Hygiene: Min guard;Sit to/from stand Toileting - Clothing Manipulation Details (indicate cue type and reason): educated on option of AE, safety during peri-care while adhering to precautions with pt verbalizing understanding, reports able to complete this AM    Tub/Shower Transfer Details (indicate cue type and reason): educated on using built-in shower seat initially during task completion for increased safety and increased adherence to back precautions with pt verbalizing understanding  Functional mobility during ADLs: Supervision/safety General ADL Comments: educated pt on back precautions, safety and compensatory techniques while adhering to back precautions with pt verbalizing understanding; pt requiring min verbal cues to adhere to back precautions during functional task completion                          Pertinent Vitals/Pain Pain Assessment: No/denies pain  Extremity/Trunk Assessment Upper Extremity Assessment Upper Extremity Assessment: Overall WFL for tasks assessed   Lower Extremity Assessment Lower Extremity Assessment: Defer to PT evaluation       Communication Communication Communication: No  difficulties   Cognition Arousal/Alertness: Awake/alert Behavior During Therapy: WFL for tasks assessed/performed Overall Cognitive Status: Within Functional Limits for tasks assessed                                                      Home Living Family/patient expects to be discharged to:: Private residence Living Arrangements: Spouse/significant other Available Help at Discharge: Family Type of Home: House Home Access: Level entry     Home Layout: One level     Bathroom Shower/Tub: Producer, television/film/video: Handicapped height     Home Equipment: Information systems manager - built Designer, fashion/clothing: Reacher        Prior Functioning/Environment Level of Independence: Independent                 OT Problem List: Decreased knowledge of precautions;Decreased knowledge of use of DME or AE         OT Goals(Current goals can be found in the care plan section) Acute Rehab OT Goals Patient Stated Goal: return home today  OT Goal Formulation: All assessment and education complete, DC therapy                                 AM-PAC PT "6 Clicks" Daily Activity     Outcome Measure Help from another person eating meals?: None Help from another person taking care of personal grooming?: None Help from another person toileting, which includes using toliet, bedpan, or urinal?: A Little Help from another person bathing (including washing, rinsing, drying)?: A Little Help from another person to put on and taking off regular upper body clothing?: None Help from another person to put on and taking off regular lower body clothing?: A Little 6 Click Score: 21   End of Session Equipment Utilized During Treatment: Gait belt Nurse Communication: Mobility status  Activity Tolerance: Patient tolerated treatment well Patient left: in chair;with call bell/phone within reach;with chair alarm set  OT Visit Diagnosis: Other  abnormalities of gait and mobility (R26.89)                Time: 1610-9604 OT Time Calculation (min): 26 min Charges:  OT General Charges $OT Visit: 1 Visit OT Evaluation $OT Eval Low Complexity: 1 Low OT Treatments $Self Care/Home Management : 8-22 mins G-Codes:     Marcy Siren, OT Pager [phone] 12/15/2017   [name] 12/15/2017, 10:02 AM

## 2018-01-06 DIAGNOSIS — M545 Low back pain: Secondary | ICD-10-CM | POA: Diagnosis not present

## 2018-01-12 DIAGNOSIS — M545 Low back pain: Secondary | ICD-10-CM | POA: Diagnosis not present

## 2018-01-14 DIAGNOSIS — M545 Low back pain: Secondary | ICD-10-CM | POA: Diagnosis not present

## 2018-01-18 DIAGNOSIS — M545 Low back pain: Secondary | ICD-10-CM | POA: Diagnosis not present

## 2018-01-20 DIAGNOSIS — M545 Low back pain: Secondary | ICD-10-CM | POA: Diagnosis not present

## 2018-01-25 DIAGNOSIS — M545 Low back pain: Secondary | ICD-10-CM | POA: Diagnosis not present

## 2018-03-13 DIAGNOSIS — N390 Urinary tract infection, site not specified: Secondary | ICD-10-CM | POA: Diagnosis not present

## 2018-03-25 ENCOUNTER — Encounter: Payer: Self-pay | Admitting: Internal Medicine

## 2018-03-25 ENCOUNTER — Ambulatory Visit (INDEPENDENT_AMBULATORY_CARE_PROVIDER_SITE_OTHER): Payer: BLUE CROSS/BLUE SHIELD | Admitting: Internal Medicine

## 2018-03-25 VITALS — BP 110/70 | HR 81 | Temp 97.7°F

## 2018-03-25 DIAGNOSIS — E039 Hypothyroidism, unspecified: Secondary | ICD-10-CM | POA: Diagnosis not present

## 2018-03-25 DIAGNOSIS — B373 Candidiasis of vulva and vagina: Secondary | ICD-10-CM

## 2018-03-25 DIAGNOSIS — B3731 Acute candidiasis of vulva and vagina: Secondary | ICD-10-CM

## 2018-03-25 DIAGNOSIS — I10 Essential (primary) hypertension: Secondary | ICD-10-CM | POA: Diagnosis not present

## 2018-03-25 DIAGNOSIS — R300 Dysuria: Secondary | ICD-10-CM

## 2018-03-25 DIAGNOSIS — Z0000 Encounter for general adult medical examination without abnormal findings: Secondary | ICD-10-CM

## 2018-03-25 LAB — COMPREHENSIVE METABOLIC PANEL
ALT: 16 U/L (ref 0–35)
AST: 13 U/L (ref 0–37)
Albumin: 3.9 g/dL (ref 3.5–5.2)
Alkaline Phosphatase: 53 U/L (ref 39–117)
BUN: 15 mg/dL (ref 6–23)
CO2: 29 mEq/L (ref 19–32)
Calcium: 9.5 mg/dL (ref 8.4–10.5)
Chloride: 103 mEq/L (ref 96–112)
Creatinine, Ser: 0.78 mg/dL (ref 0.40–1.20)
GFR: 79.42 mL/min (ref >60.00)
Glucose, Bld: 88 mg/dL (ref 70–99)
Potassium: 4.7 mEq/L (ref 3.5–5.1)
Sodium: 139 mEq/L (ref 135–145)
Total Bilirubin: 1.1 mg/dL (ref 0.2–1.2)
Total Protein: 6.2 g/dL (ref 6.0–8.3)

## 2018-03-25 LAB — CBC WITH DIFFERENTIAL/PLATELET
Basophils Absolute: 0.1 10*3/uL (ref 0.0–0.1)
Basophils Relative: 1 % (ref 0.0–3.0)
Eosinophils Absolute: 0.3 10*3/uL (ref 0.0–0.7)
Eosinophils Relative: 4.9 % (ref 0.0–5.0)
HCT: 42.7 % (ref 36.0–46.0)
Hemoglobin: 14.2 g/dL (ref 12.0–15.0)
Lymphocytes Relative: 27.6 % (ref 12.0–46.0)
Lymphs Abs: 1.8 10*3/uL (ref 0.7–4.0)
MCHC: 33.2 g/dL (ref 30.0–36.0)
MCV: 91.2 fl (ref 78.0–100.0)
Monocytes Absolute: 0.6 10*3/uL (ref 0.1–1.0)
Monocytes Relative: 9.1 % (ref 3.0–12.0)
Neutro Abs: 3.8 10*3/uL (ref 1.4–7.7)
Neutrophils Relative %: 57.4 % (ref 43.0–77.0)
Platelets: 339 10*3/uL (ref 150.0–400.0)
RBC: 4.68 Mil/uL (ref 3.87–5.11)
RDW: 14 % (ref 11.5–15.5)
WBC: 6.7 10*3/uL (ref 4.0–10.5)

## 2018-03-25 LAB — POCT URINALYSIS DIPSTICK
Bilirubin, UA: NEGATIVE
Blood, UA: NEGATIVE
Glucose, UA: NEGATIVE
Ketones, UA: NEGATIVE
Leukocytes, UA: NEGATIVE
Nitrite, UA: NEGATIVE
Protein, UA: POSITIVE — AB
Spec Grav, UA: 1.02 (ref 1.010–1.025)
Urobilinogen, UA: 0.2 E.U./dL
pH, UA: 6 (ref 5.0–8.0)

## 2018-03-25 LAB — LIPID PANEL
Cholesterol: 163 mg/dL (ref 0–200)
HDL: 47.5 mg/dL (ref >39.00)
LDL Cholesterol: 94 mg/dL (ref 0–99)
NonHDL: 115.76
Total CHOL/HDL Ratio: 3
Triglycerides: 108 mg/dL (ref 0.0–149.0)
VLDL: 21.6 mg/dL (ref 0.0–40.0)

## 2018-03-25 LAB — TSH: TSH: 0.07 u[IU]/mL — ABNORMAL LOW (ref 0.35–4.50)

## 2018-03-25 MED ORDER — BENAZEPRIL-HYDROCHLOROTHIAZIDE 20-25 MG PO TABS: ORAL_TABLET | ORAL | 1 refills | Status: DC

## 2018-03-25 MED ORDER — MONISTAT 7 COMPLETE THERAPY 100-2 MG-% VA KIT: PACK | VAGINAL | 1 refills | Status: DC

## 2018-03-25 MED ORDER — FUROSEMIDE 40 MG PO TABS: ORAL_TABLET | ORAL | 1 refills | Status: DC

## 2018-03-25 NOTE — Patient Instructions (Addendum)
Limit your sodium (Salt) intake   Cooking With Less Salt Cooking with less salt is one way to reduce the amount of sodium you get from food. Depending on your condition and overall health, your health care provider or diet and nutrition specialist (dietitian) may recommend that you reduce your sodium intake. Most people should have less than 2,300 milligrams (mg) of sodium each day. If you have high blood pressure (hypertension), you may need to limit your sodium to 1,500 mg each day. Follow the tips below to help reduce your sodium intake. What do I need to know about cooking with less salt? Shopping  Buy sodium-free or low-sodium products. Look for the following words on food labels: ? Low-sodium. ? Sodium-free. ? Reduced-sodium. ? No salt added. ? Unsalted.  Buy fresh or frozen vegetables. Avoid canned vegetables.  Avoid buying meats or protein foods that have been injected with broth or saline solution.  Avoid cured or smoked meats, such as hot dogs, bacon, salami, ham, and bologna. Reading food labels  Check the food label before buying or using packaged ingredients.  Look for products with no more than 140 mg of sodium in one serving.  Do not choose foods with salt as one of the first three ingredients on the ingredients list. If salt is one of the first three ingredients, it usually means the item is high in sodium, because ingredients are listed in order of amount in the food item. Cooking  Use herbs, seasonings without salt, and spices as substitutes for salt in foods.  Use sodium-free baking soda when baking.  Grill, braise, or roast foods to add flavor with less salt.  Avoid adding salt to pasta, rice, or hot cereals while cooking.  Drain and rinse canned vegetables before use.  Avoid adding salt when cooking sweets and desserts.  Cook with low-sodium ingredients. What are some salt alternatives? The following are herbs, seasonings, and spices that can be used  instead of salt to give taste to your food. Herbs should be fresh or dried. Do not choose packaged mixes. Next to the name of the herb, spice, or seasoning are some examples of foods you can pair it with. Herbs  Bay leaves - Soups, meat and vegetable dishes, and spaghetti sauce.  Basil - [employer], soups, pasta, and fish dishes.  Cilantro - Meat, poultry, and vegetable dishes.  Chili powder - Marinades and Mexican dishes.  Chives - Salad dressings and potato dishes.  Cumin - Mexican dishes, couscous, and meat dishes.  Dill - Fish dishes, sauces, and salads.  Fennel - Meat and vegetable dishes, breads, and cookies.  Garlic (do not use garlic salt) - [place] dishes, meat dishes, salad dressings, and sauces.  Marjoram - Soups, potato dishes, and meat dishes.  Oregano - Pizza and spaghetti sauce.  Parsley - Salads, soups, pasta, and meat dishes.  Rosemary - [place] dishes, salad dressings, soups, and red meats.  Saffron - Fish dishes, pasta, and some poultry dishes.  Sage - Stuffings and sauces.  Tarragon - Fish and [employer].  Thyme - Stuffing, meat, and fish dishes. Seasonings  Lemon juice - Fish dishes, poultry dishes, vegetables, and salads.  Vinegar - Salad dressings, vegetables, and fish dishes. Spices  Cinnamon - Sweet dishes, such as cakes, cookies, and puddings.  Cloves - Gingerbread, puddings, and marinades for meats.  Curry - Vegetable dishes, fish and poultry dishes, and stir-fry dishes.  Ginger - Vegetables dishes, fish dishes, and stir-fry dishes.  Nutmeg - Pasta,  vegetables, poultry, fish dishes, and custard. What are some low-sodium ingredients and foods?  Fresh or frozen fruits and vegetables with no sauce added.  Fresh or frozen whole meats, poultry, and fish with no sauce added.  Eggs.  Noodles, pasta, quinoa, rice.  Shredded or puffed wheat or puffed rice.  Regular or quick oats.  Milk, yogurt, hard cheeses, and low-sodium  cheeses. Good cheese choices include Swiss, [employer], and [address]zzarella. Always check the label for the serving size and sodium content.  Unsalted butter or margarine.  Unsalted nuts.  Sherbet or ice cream (keep to  cup per serving).  Homemade pudding.  Sodium-free baking soda and baking powder. This is not a complete list of low-sodium ingredients and foods. Contact your dietitian for more options. Summary  Cooking with less salt is one way to reduce the amount of sodium that you get from food.  Buy sodium-free or low-sodium products.  Check the food label before using or buying packaged ingredients.  Use herbs, seasonings without salt, and spices as substitutes for salt in foods. This information is not intended to replace advice given to you by your health care provider. Make sure you discuss any questions you have with your health care provider. Document Released: 10/06/2005 Document Revised: 10/14/2016 Document Reviewed: 10/14/2016 Elsevier Interactive Patient Education  2017 [employer]  Low-Sodium Eating Plan Sodium, which is an element that makes up salt, helps you maintain a healthy balance of fluids in your body. Too much sodium can increase your blood pressure and cause fluid and waste to be held in your body. Your health care provider or dietitian may recommend following this plan if you have high blood pressure (hypertension), kidney disease, liver disease, or heart failure. Eating less sodium can help lower your blood pressure, reduce swelling, and protect your heart, liver, and kidneys. What are tips for following this plan? General guidelines  Most people on this plan should limit their sodium intake to 1,500-2,000 mg (milligrams) of sodium each day. Reading food labels  The Nutrition Facts label lists the amount of sodium in one serving of the food. If you eat more than one serving, you must multiply the listed amount of sodium by the number of  servings.  Choose foods with less than 140 mg of sodium per serving.  Avoid foods with 300 mg of sodium or more per serving. Shopping  Look for lower-sodium products, often labeled as "low-sodium" or "no salt added."  Always check the sodium content even if foods are labeled as "unsalted" or "no salt added".  Buy fresh foods. ? Avoid canned foods and premade or frozen meals. ? Avoid canned, cured, or processed meats  Buy breads that have less than 80 mg of sodium per slice. Cooking  Eat more home-cooked food and less restaurant, buffet, and fast food.  Avoid adding salt when cooking. Use salt-free seasonings or herbs instead of table salt or sea salt. Check with your health care provider or pharmacist before using salt substitutes.  Cook with plant-based oils, such as canola, sunflower, or olive oil. Meal planning  When eating at a restaurant, ask that your food be prepared with less salt or no salt, if possible.  Avoid foods that contain MSG (monosodium glutamate). MSG is sometimes added to Cong[place] food, bouillon, and some canned foods. What foods are recommended? The items listed may not be a complete list. Talk with your dietitian about what dietary choices are best for you. Grains Low-sodium cereals, including oats, puffed wheat  and rice, and shredded wheat. Low-sodium crackers. Unsalted rice. Unsalted pasta. Low-sodium bread. Whole-grain breads and whole-grain pasta. Vegetables Fresh or frozen vegetables. "No salt added" canned vegetables. "No salt added" tomato sauce and paste. Low-sodium or reduced-sodium tomato and vegetable juice. Fruits Fresh, frozen, or canned fruit. Fruit juice. Meats and other protein foods Fresh or frozen (no salt added) meat, poultry, seafood, and fish. Low-sodium canned tuna and salmon. Unsalted nuts. Dried peas, beans, and lentils without added salt. Unsalted canned beans. Eggs. Unsalted nut butters. Dairy Milk. Soy milk. Cheese that is  naturally low in sodium, such as ricotta cheese, fresh mozzarella, or Swiss cheese Low-sodium or reduced-sodium cheese. Cream cheese. Yogurt. Fats and oils Unsalted butter. Unsalted margarine with no trans fat. Vegetable oils such as canola or olive oils. Seasonings and other foods Fresh and dried herbs and spices. Salt-free seasonings. Low-sodium mustard and ketchup. Sodium-free salad dressing. Sodium-free light mayonnaise. Fresh or refrigerated horseradish. Lemon juice. Vinegar. Homemade, reduced-sodium, or low-sodium soups. Unsalted popcorn and pretzels. Low-salt or salt-free chips. What foods are not recommended? The items listed may not be a complete list. Talk with your dietitian about what dietary choices are best for you. Grains Instant hot cereals. Bread stuffing, pancake, and biscuit mixes. Croutons. Seasoned rice or pasta mixes. Noodle soup cups. Boxed or frozen macaroni and cheese. Regular salted crackers. Self-rising flour. Vegetables Sauerkraut, pickled vegetables, and relishes. Olives. [place] fries. Onion rings. Regular canned vegetables (not low-sodium or reduced-sodium). Regular canned tomato sauce and paste (not low-sodium or reduced-sodium). Regular tomato and vegetable juice (not low-sodium or reduced-sodium). Frozen vegetables in sauces. Meats and other protein foods Meat or fish that is salted, canned, smoked, spiced, or pickled. Bacon, ham, sausage, hotdogs, corned beef, chipped beef, packaged lunch meats, salt pork, jerky, pickled herring, anchovies, regular canned tuna, sardines, salted nuts. Dairy Processed cheese and cheese spreads. Cheese curds. Blue cheese. Feta cheese. String cheese. Regular cottage cheese. Buttermilk. Canned milk. Fats and oils Salted butter. Regular margarine. Ghee. Bacon fat. Seasonings and other foods Onion salt, garlic salt, seasoned salt, table salt, and sea salt. Canned and packaged gravies. Worcestershire sauce. Tartar sauce. Barbecue sauce.  Teriyaki sauce. Soy sauce, including reduced-sodium. Steak sauce. Fish sauce. Oyster sauce. Cocktail sauce. Horseradish that you find on the shelf. Regular ketchup and mustard. Meat flavorings and tenderizers. Bouillon cubes. Hot sauce and Tabasco sauce. Premade or packaged marinades. Premade or packaged taco seasonings. Relishes. Regular salad dressings. Salsa. Potato and tortilla chips. Corn chips and puffs. Salted popcorn and pretzels. Canned or dried soups. Pizza. Frozen entrees and pot pies. Summary  Eating less sodium can help lower your blood pressure, reduce swelling, and protect your heart, liver, and kidneys.  Most people on this plan should limit their sodium intake to 1,500-2,000 mg (milligrams) of sodium each day.  Canned, boxed, and frozen foods are high in sodium. Restaurant foods, fast foods, and pizza are also very high in sodium. You also get sodium by adding salt to food.  Try to cook at home, eat more fresh fruits and vegetables, and eat less fast food, canned, processed, or prepared foods. This information is not intended to replace advice given to you by your health care provider. Make sure you discuss any questions you have with your health care provider. Document Released: 03/28/2002 Document Revised: 09/29/2016 Document Reviewed: 09/29/2016 Elsevier Interactive Patient Education  [employer].

## 2018-03-25 NOTE — Progress Notes (Signed)
Subjective:    Patient ID: Kayla [name], female    DOB: December 05, 1955, 62 y.o.   MRN: [id number]  HPI  62 year old patient who has not been seen in a number of years.  She was seen at an urgent care recently and treated for a UTI with Cipro. She has developed some vaginal itching and a vaginal discharge over the past few days. She also complains of lower extremity edema which she states is a chronic problem for her during the warmer summer months.  She does adhere to a salt restricted diet.  She has a history of hypertension and hypothyroidism.  Medical regimen does include benazepril hydrochlorothiazide combination  Past Medical History:  Diagnosis Date  . Arthritis   . GERD 11/11/2010  . Heart murmur    as a chld   . Hematemesis 11/11/2010  . Hypertension   . Hypothyroidism   . Obesity   . VASOVAGAL SYNCOPE 11/11/2010     Social History   Socioeconomic History  . Marital status: Married    Spouse name: Not on file  . Number of children: 1 A  . Years of education: Not on file  . Highest education level: Not on file  Occupational History  . Occupation: ADMIN ASSIST    Employer: [employer]  Social Needs  . Financial resource strain: Not on file  . Food insecurity:    Worry: Not on file    Inability: Not on file  . Transportation needs:    Medical: Not on file    Non-medical: Not on file  Tobacco Use  . Smoking status: Never Smoker  . Smokeless tobacco: Never Used  Substance and Sexual Activity  . Alcohol use: No  . Drug use: No  . Sexual activity: Not on file  Lifestyle  . Physical activity:    Days per week: Not on file    Minutes per session: Not on file  . Stress: Not on file  Relationships  . Social connections:    Talks on phone: Not on file    Gets together: Not on file    Attends religious service: Not on file    Active member of club or organization: Not on file    Attends meetings of clubs or organizations: Not on file    Relationship  status: Not on file  . Intimate partner violence:    Fear of current or ex partner: Not on file    Emotionally abused: Not on file    Physically abused: Not on file    Forced sexual activity: Not on file  Other Topics Concern  . Not on file  Social History Narrative  . Not on file    Past Surgical History:  Procedure Laterality Date  . ABDOMINAL HYSTERECTOMY    . CHOLECYSTECTOMY    . KNEE ARTHROSCOPY Left   . LAPAROSCOPIC GASTRIC BANDING    . LAPAROSCOPIC REPAIR AND REMOVAL OF GASTRIC BAND N/A 10/05/2013   Procedure: LAPAROSCOPIC REPAIR AND REMOVAL OF GASTRIC BAND;  Surgeon: Kandis Cocking, MD;  Location: [facility];  Service: General;  Laterality: N/A;  . LUMBAR LAMINECTOMY/DECOMPRESSION MICRODISCECTOMY N/A 12/14/2017   Procedure: Microlumbar decompression L4-5, excision of facet cyst;  Surgeon: Jene Every, MD;  Location: [facility];  Service: Orthopedics;  Laterality: N/A;  120 mins  . 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 UPPER GI ENDOSCOPY  10/05/2013   Procedure: UPPER GI ENDOSCOPY;  Surgeon: Kandis Cocking, MD;  Location: [facility];  Service: General;;    Family History  Problem Relation Age of Onset  . Alzheimer's disease Mother   . Hypertension Mother   . Hypertension Father   . Dementia Father   . Other Father        stomcah surgery with ostomy bag  . Asthma Paternal Grandmother     Allergies  Allergen Reactions  . Penicillins Other (See Comments)    Childhood allergy - severe reaction Has patient had a PCN reaction causing immediate rash, facial/tongue/throat swelling, SOB or lightheadedness with hypotension: yes Has patient had a PCN reaction causing severe rash involving mucus membranes or skin necrosis: no Has patient had a PCN reaction that required hospitalization: yes Has patient had a PCN reaction occurring within the last 10 years: no If all of the above answers are "NO", then may proceed with Cephalosporin use.   Jonne Ply. Asa Arthritis  Strength-Antacid [Aspirin Buffered] Hives and Swelling  . Aspirin   . Cefaclor Hives  . Morphine Nausea And Vomiting  . Tetracycline Hives  . Vicodin [Hydrocodone-Acetaminophen] Rash    Rash over face, rash and itching over arms. Pt can take tylenol with no problem.    Current Outpatient Medications on File Prior to Visit  Medication Sig Dispense Refill  . acetaminophen (TYLENOL) 500 MG tablet Take 1,000 mg by mouth every 6 (six) hours as needed for mild pain or moderate pain.    Mack Guise. ARMOUR THYROID 90 MG tablet Take 90 mg by mouth 2 (two) times daily.   1  . cholecalciferol (VITAMIN D) 1000 units tablet Take 1 tablet by mouth daily.     Marland Kitchen. OVER THE COUNTER MEDICATION Take 1 tablet by mouth daily.     Marland Kitchen. OVER THE COUNTER MEDICATION Take 1 tablet by mouth daily.     . Probiotic Product (PROBIOTIC DAILY PO) Take 1 capsule by mouth daily.     . traMADol (ULTRAM) 50 MG tablet Take 1 tablet (50 mg total) by mouth every 6 (six) hours as needed. 40 tablet 0  . Cyanocobalamin (VITAMIN B 12 PO) Take 1 tablet by mouth daily.     Marland Kitchen. docusate sodium (COLACE) 100 MG capsule Take 1 capsule (100 mg total) by mouth 2 (two) times daily. (Patient not taking: Reported on 03/25/2018) 40 capsule 1  . methocarbamol (ROBAXIN) 500 MG tablet Take 1 tablet (500 mg total) by mouth every 6 (six) hours as needed for muscle spasms. (Patient not taking: Reported on 03/25/2018) 40 tablet 1  . polyethylene glycol (MIRALAX) packet Take 17 g by mouth daily. (Patient not taking: Reported on 03/25/2018) 14 each 0  . progesterone (PROMETRIUM) 100 MG capsule Take 300 mg by mouth at bedtime.  2   No current facility-administered medications on file prior to visit.     BP 110/70 (BP Location: Right Arm, Patient Position: Sitting, Cuff Size: Large)   Pulse 81   Temp 97.7 F (36.5 C) (Oral)   SpO2 95%     Review of Systems  Constitutional: Negative.   HENT: Negative for congestion, dental problem, hearing loss, rhinorrhea, sinus  pressure, sore throat and tinnitus.   Eyes: Negative for pain, discharge and visual disturbance.  Respiratory: Negative for cough and shortness of breath.   Cardiovascular: Positive for leg swelling. Negative for chest pain and palpitations.  Gastrointestinal: Negative for abdominal distention, abdominal pain, blood in stool, constipation, diarrhea, nausea and vomiting.  Genitourinary: Positive for vaginal discharge. Negative for difficulty urinating, dysuria, flank pain, frequency,  hematuria, pelvic pain, urgency, vaginal bleeding and vaginal pain.  Musculoskeletal: Negative for arthralgias, gait problem and joint swelling.  Skin: Negative for rash.  Neurological: Negative for dizziness, syncope, speech difficulty, weakness, numbness and headaches.  Hematological: Negative for adenopathy.  Psychiatric/Behavioral: Negative for agitation, behavioral problems and dysphoric mood. The patient is not nervous/anxious.        Objective:   Physical Exam  Constitutional: She is oriented to person, place, and time. She appears well-developed and well-nourished.  Blood pressure 110/76  HENT:  Head: Normocephalic and atraumatic.  Right Ear: External ear normal.  Left Ear: External ear normal.  Mouth/Throat: Oropharynx is clear and moist.  Eyes: Conjunctivae and EOM are normal.  Neck: Normal range of motion. Neck supple. No JVD present. No thyromegaly present.  Cardiovascular: Normal rate, regular rhythm, normal heart sounds and intact distal pulses.  No murmur heard. Pulmonary/Chest: Effort normal and breath sounds normal. She has no wheezes. She has no rales.  Abdominal: Soft. Bowel sounds are normal. She exhibits no distension and no mass. There is no tenderness. There is no rebound and no guarding.  Genitourinary: Vagina normal.  Musculoskeletal: Normal range of motion. She exhibits edema. She exhibits no tenderness.  +1 edema  Neurological: She is alert and oriented to person, place, and  time. She has normal reflexes. She displays normal reflexes. No cranial nerve deficit. She exhibits normal muscle tone. Coordination normal.  Skin: Skin is warm and dry. No rash noted.  Psychiatric: She has a normal mood and affect. Her behavior is normal.          Assessment & Plan:  Essential hypertension well-controlled  Lower extremity edema.  Will attempt to decrease sodium intake Hypothyroidism  Will check updated lab.  Patient does have a physical scheduled later this month  Rogelia Boga

## 2018-03-30 ENCOUNTER — Other Ambulatory Visit: Payer: Self-pay | Admitting: *Deleted

## 2018-03-30 DIAGNOSIS — L570 Actinic keratosis: Secondary | ICD-10-CM | POA: Diagnosis not present

## 2018-03-30 DIAGNOSIS — E039 Hypothyroidism, unspecified: Secondary | ICD-10-CM

## 2018-03-30 DIAGNOSIS — D1801 Hemangioma of skin and subcutaneous tissue: Secondary | ICD-10-CM | POA: Diagnosis not present

## 2018-03-30 DIAGNOSIS — Z85828 Personal history of other malignant neoplasm of skin: Secondary | ICD-10-CM | POA: Diagnosis not present

## 2018-03-30 DIAGNOSIS — L821 Other seborrheic keratosis: Secondary | ICD-10-CM | POA: Diagnosis not present

## 2018-03-30 DIAGNOSIS — L433 Subacute (active) lichen planus: Secondary | ICD-10-CM | POA: Diagnosis not present

## 2018-03-30 MED ORDER — ARMOUR THYROID 90 MG PO TABS: ORAL_TABLET | ORAL | 0 refills | Status: DC

## 2018-04-12 ENCOUNTER — Ambulatory Visit: Payer: BLUE CROSS/BLUE SHIELD | Admitting: Internal Medicine

## 2018-04-21 ENCOUNTER — Encounter: Payer: BLUE CROSS/BLUE SHIELD | Admitting: Internal Medicine

## 2018-05-11 ENCOUNTER — Other Ambulatory Visit: Payer: Self-pay | Admitting: Internal Medicine

## 2018-05-18 ENCOUNTER — Other Ambulatory Visit: Payer: BLUE CROSS/BLUE SHIELD

## 2018-05-25 ENCOUNTER — Other Ambulatory Visit: Payer: Self-pay | Admitting: Internal Medicine

## 2018-05-28 ENCOUNTER — Encounter: Payer: Self-pay | Admitting: Family Medicine

## 2018-05-28 ENCOUNTER — Ambulatory Visit (INDEPENDENT_AMBULATORY_CARE_PROVIDER_SITE_OTHER): Payer: BLUE CROSS/BLUE SHIELD | Admitting: Family Medicine

## 2018-05-28 VITALS — BP 112/80 | HR 75 | Temp 98.1°F | Ht 71.0 in

## 2018-05-28 DIAGNOSIS — J209 Acute bronchitis, unspecified: Secondary | ICD-10-CM

## 2018-05-28 MED ORDER — BENZONATATE 100 MG PO CAPS: ORAL_CAPSULE | ORAL | 1 refills | Status: DC

## 2018-05-28 NOTE — Patient Instructions (Signed)

## 2018-05-28 NOTE — Progress Notes (Signed)
Subjective:     Patient ID: Kayla [name], female   DOB: Apr 30, 1956, 62 y.o.   MRN: [id number]  HPI Patient is nonsmoker seen with six-day history of some nasal congestion, sore throat, cough. Cough is mostly nonproductive. Worse at night and interfering with sleep. [name] has some diffuse body aches. Possible low-grade fever past couple days. No headaches. No facial pain. No sick contacts.  Past Medical History:  Diagnosis Date  . Arthritis   . GERD 11/11/2010  . Heart murmur    as a chld   . Hematemesis 11/11/2010  . Hypertension   . Hypothyroidism   . Obesity   . VASOVAGAL SYNCOPE 11/11/2010   Past Surgical History:  Procedure Laterality Date  . ABDOMINAL HYSTERECTOMY    . CHOLECYSTECTOMY    . KNEE ARTHROSCOPY Left   . LAPAROSCOPIC GASTRIC BANDING    . LAPAROSCOPIC REPAIR AND REMOVAL OF GASTRIC BAND N/A 10/05/2013   Procedure: LAPAROSCOPIC REPAIR AND REMOVAL OF GASTRIC BAND;  Surgeon: Kayla Cockingavid H Newman, MD;  Location: [facility];  Service: General;  Laterality: N/A;  . LUMBAR LAMINECTOMY/DECOMPRESSION MICRODISCECTOMY N/A 12/14/2017   Procedure: Microlumbar decompression L4-5, excision of facet cyst;  Surgeon: Kayla EveryBeane, Jeffrey, MD;  Location[facility];  Service: Orthopedics;  Laterality: N/A;  120 mins  . 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 UPPER GI ENDOSCOPY  10/05/2013   Procedure: UPPER GI ENDOSCOPY;  Surgeon: Kayla Cockingavid H Newman, MD;  Location: [facility];  Service: General;;    reports that she has never smoked. She has never used smokeless tobacco. She reports that she does not drink alcohol or use drugs. family history includes Alzheimer's disease in her mother; Asthma in her paternal grandmother; Dementia in her father; Hypertension in her father and mother; Other in her father. Allergies  Allergen Reactions  . Penicillins Other (See Comments)    Childhood allergy - severe reaction Has patient had a PCN reaction causing immediate rash, facial/tongue/throat swelling, SOB  or lightheadedness with hypotension: yes Has patient had a PCN reaction causing severe rash involving mucus membranes or skin necrosis: no Has patient had a PCN reaction that required hospitalization: yes Has patient had a PCN reaction occurring within the last 10 years: no If all of the above answers are "NO", then may proceed with Cephalosporin use.   Kayla [name] [Aspirin Buffered] Hives and Swelling  . Aspirin   . Cefaclor Hives  . Morphine Nausea And Vomiting  . Tetracycline Hives  . Vicodin [Hydrocodone-Acetaminophen] Rash    Rash over face, rash and itching over arms. Pt can take tylenol with no problem.     Review of Systems  Constitutional: Negative for chills.  HENT: Positive for congestion and sore throat.   Respiratory: Positive for cough. Negative for shortness of breath and wheezing.        Objective:   Physical Exam  Constitutional: She appears well-developed and well-nourished.  HENT:  Right Ear: Tympanic membrane normal.  Left Ear: Tympanic membrane normal.  Mouth/Throat: Oropharynx is clear and moist. No oropharyngeal exudate.  Cardiovascular: Normal rate and regular rhythm.  Pulmonary/Chest: Effort normal and breath sounds normal.       Assessment:     Cough. Suspect acute viral bronchitis. Nonfocal exam.    Plan:     -Tessalon Perles 100 mg 1-2 every 8 hours as needed for cough. She has had prior reported allergy with Vicodin. Follow-up immediately for any fever or worsening symptoms  Kayla [name]  Kayla Europe MD [facility]

## 2018-05-30 DIAGNOSIS — J029 Acute pharyngitis, unspecified: Secondary | ICD-10-CM | POA: Diagnosis not present

## 2018-05-30 DIAGNOSIS — J209 Acute bronchitis, unspecified: Secondary | ICD-10-CM | POA: Diagnosis not present

## 2018-06-11 DIAGNOSIS — N951 Menopausal and female climacteric states: Secondary | ICD-10-CM | POA: Diagnosis not present

## 2018-06-15 ENCOUNTER — Ambulatory Visit (INDEPENDENT_AMBULATORY_CARE_PROVIDER_SITE_OTHER): Payer: BLUE CROSS/BLUE SHIELD | Admitting: Internal Medicine

## 2018-06-15 ENCOUNTER — Encounter: Payer: Self-pay | Admitting: Internal Medicine

## 2018-06-15 VITALS — BP 110/72 | HR 81 | Temp 98.2°F | Ht 69.0 in

## 2018-06-15 DIAGNOSIS — Z0000 Encounter for general adult medical examination without abnormal findings: Secondary | ICD-10-CM | POA: Diagnosis not present

## 2018-06-15 DIAGNOSIS — I10 Essential (primary) hypertension: Secondary | ICD-10-CM

## 2018-06-15 DIAGNOSIS — E039 Hypothyroidism, unspecified: Secondary | ICD-10-CM | POA: Diagnosis not present

## 2018-06-15 LAB — TSH: TSH: 0.04 u[IU]/mL — ABNORMAL LOW (ref 0.35–4.50)

## 2018-06-15 NOTE — Patient Instructions (Signed)
Limit your sodium (Salt) intake  Please check your blood pressure on a regular basis.  If it is consistently greater than 140/90, please make an office appointment.    It is important that you exercise regularly, at least 20 minutes 3 to 4 times per week.  If you develop chest pain or shortness of breath seek  medical attention.  Return in 6 months for follow-up   

## 2018-06-15 NOTE — Progress Notes (Signed)
Subjective:    Patient ID: Kayla [name], female    DOB: 19-Mar-1956, 62 y.o.   MRN: [id number]  HPI  62 year old patient who is seen today for a preventive health examination. She has a history of hypothyroidism as well as essential hypertension. TSH was suppressed earlier in the year and Armour Thyroid was down titrated. She has a history of exogenous obesity and is status post laparoscopic gastric banding but required removal of the gastric band in 2014  She had lumbar surgery in February of this year for surgical removal of the facet cyst at the L4-L5 level  Colonoscopy by Dr. Loreta Ave in 2009.  Does get annual mammograms.  She is status post right total knee replacement therapy.      Alcohol-Tobacco  Smoking Status: never   Allergies:  1) ! Pcn  2) ! Tetracycline  3) ! Morphine  4) ! Ceclor   Past History:  Past Medical History:   Allergies seasonal  exogenous obesity  history of type 2 diabetes   Past Surgical History:  Lap Band surgery October 2008  Hysterectomy- completes 1993  Cholecystectomy- 1995  Right  knee arthroscopic surgery and total replacement  Colonoscopy in November 2009  Family History:   Paternal grandfather had emphesema  Father has had "black lung"  history of dementia. Died  in a nursing home Paternal grandmother had asthma  mother died in age 20, complications of senile dementia  one brother two sisters  one sister had advanced mental retardation and died of complications of cerebral vascular disease   Social History:    Works as a Print production planner  Married with one child  Never smoked  Alcohol on occ.Smoking Status: never  Past Medical History:  Diagnosis Date  . Arthritis   . GERD 11/11/2010  . Heart murmur    as a chld   . Hematemesis 11/11/2010  . Hypertension   . Hypothyroidism   . Obesity   . VASOVAGAL SYNCOPE 11/11/2010     Social History   Socioeconomic History  . Marital status: Married    Spouse name: Not on  file  . Number of children: 1 A  . Years of education: Not on file  . Highest education level: Not on file  Occupational History  . Occupation: ADMIN ASSIST    Employer: [employer].  Social Needs  . Financial resource strain: Not on file  . Food insecurity:    Worry: Not on file    Inability: Not on file  . Transportation needs:    Medical: Not on file    Non-medical: Not on file  Tobacco Use  . Smoking status: Never Smoker  . Smokeless tobacco: Never Used  Substance and Sexual Activity  . Alcohol use: No  . Drug use: No  . Sexual activity: Not on file  Lifestyle  . Physical activity:    Days per week: Not on file    Minutes per session: Not on file  . Stress: Not on file  Relationships  . Social connections:    Talks on phone: Not on file    Gets together: Not on file    Attends religious service: Not on file    Active member of club or organization: Not on file    Attends meetings of clubs or organizations: Not on file    Relationship status: Not on file  . Intimate partner violence:    Fear of current or ex partner: Not on file  Emotionally abused: Not on file    Physically abused: Not on file    Forced sexual activity: Not on file  Other Topics Concern  . Not on file  Social History Narrative  . Not on file    Past Surgical History:  Procedure Laterality Date  . ABDOMINAL HYSTERECTOMY    . CHOLECYSTECTOMY    . KNEE ARTHROSCOPY Left   . LAPAROSCOPIC GASTRIC BANDING    . LAPAROSCOPIC REPAIR AND REMOVAL OF GASTRIC BAND N/A 10/05/2013   Procedure: LAPAROSCOPIC REPAIR AND REMOVAL OF GASTRIC BAND;  Surgeon: Kandis Cockingavid H Newman, MD;  Location: [facility];  Service: General;  Laterality: N/A;  . LUMBAR LAMINECTOMY/DECOMPRESSION MICRODISCECTOMY N/A 12/14/2017   Procedure: Microlumbar decompression L4-5, excision of facet cyst;  Surgeon: Jene EveryBeane, Jeffrey, MD;  Location: [facility];  Service: Orthopedics;  Laterality: N/A;  120 mins  . TOTAL KNEE ARTHROPLASTY Right 10  2012   right  bean  . UPPER GI ENDOSCOPY  10/05/2013   Procedure: UPPER GI ENDOSCOPY;  Surgeon: Kandis Cockingavid H Newman, MD;  Location[facility];  Service: General;;    Family History  Problem Relation Age of Onset  . Alzheimer's disease Mother   . Hypertension Mother   . Hypertension Father   . Dementia Father   . Other Father        stomcah surgery with ostomy bag  . Asthma Paternal Grandmother     Allergies  Allergen Reactions  . Penicillins Other (See Comments)    Childhood allergy - severe reaction Has patient had a PCN reaction causing immediate rash, facial/tongue/throat swelling, SOB or lightheadedness with hypotension: yes Has patient had a PCN reaction causing severe rash involving mucus membranes or skin necrosis: no Has patient had a PCN reaction that required hospitalization: yes Has patient had a PCN reaction occurring within the last 10 years: no If all of the above answers are "NO", then may proceed with Cephalosporin use.   Jonne Ply. Asa Arthritis Strength-Antacid [Aspirin Buffered] Hives and Swelling  . Aspirin   . Cefaclor Hives  . Morphine Nausea And Vomiting  . Tetracycline Hives  . Vicodin [Hydrocodone-Acetaminophen] Rash    Rash over face, rash and itching over arms. Pt can take tylenol with no problem.    Current Outpatient Medications on File Prior to Visit  Medication Sig Dispense Refill  . acetaminophen (TYLENOL) 500 MG tablet Take 1,000 mg by mouth every 6 (six) hours as needed for mild pain or moderate pain.    Mack Guise. ARMOUR THYROID 90 MG tablet Take 90 mg twice daily 4 times per week and once daily Monday, Wednesday, Friday. 132 tablet 0  . benazepril-hydrochlorthiazide (LOTENSIN HCT) 20-25 MG tablet TAKE 1 TABLET BY MOUTH EVERY DAY FILLABLE 90 tablet 1  . cholecalciferol (VITAMIN D) 1000 units tablet Take 1 tablet by mouth daily.     . Cyanocobalamin (VITAMIN B 12 PO) Take 1 tablet by mouth daily.     . furosemide (LASIX) 40 MG tablet TAKE 1 TABLET BY MOUTH EVERY DAY  AS NEEDED FOR FLUID RETENTION 90 tablet 1  . OVER THE COUNTER MEDICATION Take 1 tablet by mouth daily.     Marland Kitchen. OVER THE COUNTER MEDICATION Take 1 tablet by mouth daily.     . Probiotic Product (PROBIOTIC DAILY PO) Take 1 capsule by mouth daily.      No current facility-administered medications on file prior to visit.     BP 110/72 (BP Location: Right Wrist, Patient Position: Sitting, Cuff Size: Large)  Pulse 81   Temp 98.2 F (36.8 C) (Oral)   Ht 5\' 9"  (1.753 m)   SpO2 97%   BMI 44.93 kg/m     Review of Systems  Constitutional: Positive for unexpected weight change.  HENT: Negative for congestion, dental problem, hearing loss, rhinorrhea, sinus pressure, sore throat and tinnitus.   Eyes: Negative for pain, discharge and visual disturbance.  Respiratory: Negative for cough and shortness of breath.   Cardiovascular: Positive for leg swelling. Negative for chest pain and palpitations.  Gastrointestinal: Negative for abdominal distention, abdominal pain, blood in stool, constipation, diarrhea, nausea and vomiting.  Genitourinary: Negative for difficulty urinating, dysuria, flank pain, frequency, hematuria, pelvic pain, urgency, vaginal bleeding, vaginal discharge and vaginal pain.  Musculoskeletal: Negative for arthralgias, gait problem and joint swelling.  Skin: Negative for rash.  Neurological: Negative for dizziness, syncope, speech difficulty, weakness, numbness and headaches.  Hematological: Negative for adenopathy.  Psychiatric/Behavioral: Negative for agitation, behavioral problems and dysphoric mood. The patient is not nervous/anxious.        Objective:   Physical Exam  Constitutional: She is oriented to person, place, and time. She appears well-developed and well-nourished.  Blood pressure 122/78  HENT:  Head: Normocephalic and atraumatic.  Right Ear: External ear normal.  Left Ear: External ear normal.  Mouth/Throat: Oropharynx is clear and moist.  Eyes: Conjunctivae  and EOM are normal.  Neck: Normal range of motion. Neck supple. No JVD present. No thyromegaly present.  Cardiovascular: Normal rate, regular rhythm, normal heart sounds and intact distal pulses.  No murmur heard. Pulmonary/Chest: Effort normal and breath sounds normal. She has no wheezes. She has no rales.  Abdominal: Soft. Bowel sounds are normal. She exhibits no distension and no mass. There is no tenderness. There is no rebound and no guarding.  Surgical scar right upper quadrant  Genitourinary: Vagina normal.  Musculoskeletal: Normal range of motion. She exhibits edema. She exhibits no tenderness.  +2 pedal edema  Neurological: She is alert and oriented to person, place, and time. She has normal reflexes. She displays normal reflexes. No cranial nerve deficit. She exhibits normal muscle tone. Coordination normal.  Skin: Skin is warm and dry. No rash noted.  Psychiatric: She has a normal mood and affect. Her behavior is normal.          Assessment & Plan:   Preventive health.  We will schedule follow-up colonoscopy with Dr. Loreta Ave Review updated lab Osteoarthritis status post right total knee replacement surgery Exogenous obesity Hypothyroidism.  Will review a TSH Essential hypertension well-controlled  Low-salt diet recommended Patient will follow-up with new PCP in 6 months  Gordy Savers

## 2018-06-16 ENCOUNTER — Other Ambulatory Visit: Payer: Self-pay | Admitting: Internal Medicine

## 2018-06-16 MED ORDER — THYROID 60 MG PO TABS: 60.0000 mg | ORAL_TABLET | Freq: Every day | ORAL | 4 refills | Status: DC

## 2018-07-06 DIAGNOSIS — H2513 Age-related nuclear cataract, bilateral: Secondary | ICD-10-CM | POA: Diagnosis not present

## 2018-07-06 DIAGNOSIS — H04123 Dry eye syndrome of bilateral lacrimal glands: Secondary | ICD-10-CM | POA: Diagnosis not present

## 2018-07-06 DIAGNOSIS — H524 Presbyopia: Secondary | ICD-10-CM | POA: Diagnosis not present

## 2018-07-15 DIAGNOSIS — Z1211 Encounter for screening for malignant neoplasm of colon: Secondary | ICD-10-CM | POA: Diagnosis not present

## 2018-09-20 ENCOUNTER — Other Ambulatory Visit: Payer: Self-pay | Admitting: Internal Medicine

## 2018-09-27 DIAGNOSIS — Z1231 Encounter for screening mammogram for malignant neoplasm of breast: Secondary | ICD-10-CM | POA: Diagnosis not present

## 2018-09-27 LAB — HM MAMMOGRAPHY

## 2018-09-29 ENCOUNTER — Encounter: Payer: Self-pay | Admitting: Internal Medicine

## 2018-09-29 ENCOUNTER — Other Ambulatory Visit: Payer: Self-pay | Admitting: Internal Medicine

## 2018-09-29 NOTE — Telephone Encounter (Signed)
Copied from CRM [phone]#[id number]. Topic: Quick Communication - Rx Refill/Question >> Sep 29, 2018 10:04 AM Kayla OddiHarris, Kayla [name] wrote: Medication: benazepril-hydrochlorthiazide (LOTENSIN HCT) 20-25 MG tablet  Patient called to request a refill for the above medication.  Patient scheduled with Ardyth HarpsHernandez 10/07/18  Preferred Pharmacy (with phone number or street name): [id number]- [place], [place] - [address] [phone] (Phone) [phone] (Fax)

## 2018-09-30 MED ORDER — BENAZEPRIL-HYDROCHLOROTHIAZIDE 20-25 MG PO TABS: ORAL_TABLET | ORAL | 0 refills | Status: DC

## 2018-09-30 NOTE — Telephone Encounter (Signed)
Labs are due at 10/07/18 OV to adhere to this medication's protocol. 30 day supply given.  Requested Prescriptions  Pending Prescriptions Disp Refills  . benazepril-hydrochlorthiazide (LOTENSIN HCT) 20-25 MG tablet 30 tablet 0    Sig: TAKE 1 TABLET BY MOUTH EVERY DAY FILLABLE     Cardiovascular:  ACEI + Diuretic Combos Failed - 09/29/2018 10:25 AM      Failed - Na in normal range and within 180 days    Sodium  Date Value Ref Range Status  03/25/2018 139 135 - 145 mEq/L Final         Failed - K in normal range and within 180 days    Potassium  Date Value Ref Range Status  03/25/2018 4.7 3.5 - 5.1 mEq/L Final         Failed - Cr in normal range and within 180 days    Creatinine, Ser  Date Value Ref Range Status  03/25/2018 0.78 0.40 - 1.20 mg/dL Final         Failed - Ca in normal range and within 180 days    Calcium  Date Value Ref Range Status  03/25/2018 9.5 8.4 - 10.5 mg/dL Final         Passed - Patient is not pregnant      Passed - Last BP in normal range    BP Readings from Last 1 Encounters:  06/15/18 110/72         Passed - Valid encounter within last 6 months    Recent Outpatient Visits          3 months ago [facility] for preventive health examination   [facility], Janett LabellaPeter F, MD   4 months ago Acute bronchitis, unspecified organism   [facility], Elberta FortisBruce W, MD   6 months ago Dysuria   [facility] Kw[facility]kowski, Janett LabellaPeter F, MD   5 years ago GERD   [facility] Kwiatkowski, Janett LabellaPeter F, MD   5 years ago Nausea with vomiting   [facility] Pan[facility], Neta MendsWanda K, MD      Future Appointments            In 1 week Philip AspenHernandez Acosta, Limmie PatriciaEstela Y, MD [facility]

## 2018-10-07 ENCOUNTER — Ambulatory Visit: Payer: BLUE CROSS/BLUE SHIELD | Admitting: Internal Medicine

## 2018-10-19 DIAGNOSIS — J209 Acute bronchitis, unspecified: Secondary | ICD-10-CM | POA: Diagnosis not present

## 2018-12-16 DIAGNOSIS — E748 Other specified disorders of carbohydrate metabolism: Secondary | ICD-10-CM | POA: Diagnosis not present

## 2018-12-16 DIAGNOSIS — E039 Hypothyroidism, unspecified: Secondary | ICD-10-CM | POA: Diagnosis not present

## 2018-12-16 DIAGNOSIS — I10 Essential (primary) hypertension: Secondary | ICD-10-CM | POA: Diagnosis not present

## 2018-12-16 DIAGNOSIS — N951 Menopausal and female climacteric states: Secondary | ICD-10-CM | POA: Diagnosis not present

## 2018-12-31 ENCOUNTER — Other Ambulatory Visit: Payer: Self-pay | Admitting: Internal Medicine

## 2019-05-03 ENCOUNTER — Other Ambulatory Visit: Payer: Self-pay | Admitting: *Deleted

## 2019-05-03 DIAGNOSIS — Z20822 Contact with and (suspected) exposure to covid-19: Secondary | ICD-10-CM

## 2019-05-03 NOTE — Progress Notes (Signed)
[id number] 

## 2019-05-04 DIAGNOSIS — R6889 Other general symptoms and signs: Secondary | ICD-10-CM | POA: Diagnosis not present

## 2019-05-08 LAB — NOVEL CORONAVIRUS, NAA: SARS-CoV-2, NAA: NOT DETECTED

## 2019-07-11 DIAGNOSIS — Z23 Encounter for immunization: Secondary | ICD-10-CM | POA: Diagnosis not present

## 2020-10-22 ENCOUNTER — Ambulatory Visit: Payer: BLUE CROSS/BLUE SHIELD | Admitting: Physician Assistant

## 2020-10-24 ENCOUNTER — Other Ambulatory Visit: Payer: Self-pay

## 2021-01-17 ENCOUNTER — Other Ambulatory Visit: Payer: Self-pay | Admitting: Orthopedic Surgery

## 2021-01-17 DIAGNOSIS — Z01811 Encounter for preprocedural respiratory examination: Secondary | ICD-10-CM

## 2021-01-22 ENCOUNTER — Other Ambulatory Visit: Payer: Self-pay | Admitting: Orthopedic Surgery

## 2021-01-22 DIAGNOSIS — M19012 Primary osteoarthritis, left shoulder: Secondary | ICD-10-CM

## 2021-01-23 NOTE — Patient Instructions (Signed)
DUE TO COVID-19 ONLY ONE VISITOR IS ALLOWED TO COME WITH YOU AND STAY IN   THE WAITING ROOM ONLY   DURING PRE OP AND PROCEDURE DAY OF SURGERY.   TWO VISITOR  MAY VISIT WITH YOU AFTER SURGERY IN YOUR PRIVATE ROOM DURING VISITING HOURS ONLY!  YOU NEED TO HAVE A COVID 19 TEST ON__4-12_____ @_______ , THIS TEST MUST BE DONE BEFORE SURGERY,  COVID TESTING SITE [address] [place] ,   IT IS ON THE RIGHT GOING OUT [address] APPROXIMATELY  2 MINUTES PAST [employer] ON THE RIGHT. ONCE YOUR COVID TEST IS COMPLETED,  PLEASE BEGIN THE QUARANTINE INSTRUCTIONS AS OUTLINED IN YOUR HANDOUT.                Kayla [name]  01/23/2021   Your procedure is scheduled on: 01-31-21   Report to [facility] Main  Entrance   Report to short stay  at        0515AM     Call this number if you have problems the morning of surgery [phone]    Remember: Do not eat food  :After Midnight   You may have clear liquids until 0430 am then nothing by mouth     CLEAR LIQUID DIET   Foods Allowed                                                                     Foods Excluded  Black Coffee and tea, regular and decaf                             liquids that you cannot  Plain Jell-O any favor except red or purple                                           see through such as: Fruit ices (not with fruit pulp)                                                  milk, soups, orange juice  Iced Popsicles                                                      All solid food Carbonated beverages, regular and diet                                    Cranberry, grape and apple juices Sports drinks like Gatorade Lightly seasoned clear broth or consume(fat free) Sugar, honey syrup     BRUSH YOUR TEETH MORNING OF SURGERY AND RINSE YOUR MOUTH OUT, NO CHEWING GUM CANDY OR MINTS.     Take these medicines the morning of surgery with A  SIP OF WATER: levothyroxine, pepcid  DO NOT  TAKE ANY DIABETIC MEDICATIONS DAY OF YOUR SURGERY                               You may not have any metal on your body including hair pins and              piercings  Do not wear jewelry, make-up, lotions, powders or perfumes, deodorant             Do not wear nail polish on your fingernails.  Do not shave  48 hours prior to surgery.            Do not bring valuables to the hospital. [facility] IS NOT             RESPONSIBLE   FOR VALUABLES.  Contacts, dentures or bridgework may not be worn into surgery.     Patients discharged the day of surgery will not be allowed to drive home. IF YOU ARE HAVING SURGERY AND GOING HOME THE SAME DAY, YOU MUST HAVE AN ADULT TO DRIVE YOU HOME AND BE WITH YOU FOR 24 HOURS. YOU MAY GO HOME BY TAXI OR UBER OR ORTHERWISE, BUT AN ADULT MUST ACCOMPANY YOU HOME AND STAY WITH YOU FOR 24 HOURS.  Name and phone number of your driver:  Special Instructions: N/A              Please read over the following fact sheets you were given: _____________________________________________________________________ [facility]- Preparing for Total Shoulder Arthroplasty    Before surgery, you can play an important role. Because skin is not sterile, your skin needs to be as free of germs as possible. You can reduce the number of germs on your skin by using the following products. . Benzoyl Peroxide Gel o Reduces the number of germs present on the skin o Applied twice a day to shoulder area starting two days before surgery    ==================================================================  Please follow these instructions carefully:  BENZOYL PEROXIDE 5% GEL  Please do not use if you have an allergy to benzoyl peroxide.   If your skin becomes reddened/irritated stop using the benzoyl peroxide.  Starting two days before surgery, apply as follows: 1. Apply benzoyl peroxide in the morning and at night. Apply after taking a shower. If you are not taking a shower clean entire  shoulder front, back, and side along with the armpit with a clean wet washcloth.  2. Place a quarter-sized dollop on your shoulder and rub in thoroughly, making sure to cover the front, back, and side of your shoulder, along with the armpit.   2 days before ____ AM   ____ PM              1 day before ____ AM   ____ PM                         3. Do this twice a day for two days.  (Last application is the night before surgery, AFTER using the CHG soap as described below).  4. Do NOT apply benzoyl peroxide gel on the day of surgery.           [facility] - Preparing for Surgery Before surgery, you can play an important role.  Because skin is not sterile, your skin needs to be as free of germs as possible.  You  can reduce the number of germs on your skin by washing with CHG (chlorahexidine gluconate) soap before surgery.  CHG is an antiseptic cleaner which kills germs and bonds with the skin to continue killing germs even after washing. Please DO NOT use if you have an allergy to CHG or antibacterial soaps.  If your skin becomes reddened/irritated stop using the CHG and inform your nurse when you arrive at Short Stay. Do not shave (including legs and underarms) for at least 48 hours prior to the first CHG shower.  You may shave your face/neck. Please follow these instructions carefully:  1.  Shower with CHG Soap the night before surgery and the  morning of Surgery.  2.  If you choose to wash your hair, wash your hair first as usual with your  normal  shampoo.  3.  After you shampoo, rinse your hair and body thoroughly to remove the  shampoo.                           4.  Use CHG as you would any other liquid soap.  You can apply chg directly  to the skin and wash                       Gently with a scrungie or clean washcloth.  5.  Apply the CHG Soap to your body ONLY FROM THE NECK DOWN.   Do not use on face/ open                           Wound or open sores. Avoid contact with eyes, ears mouth and  genitals (private parts).                       Wash face,  Genitals (private parts) with your normal soap.             6.  Wash thoroughly, paying special attention to the area where your surgery  will be performed.  7.  Thoroughly rinse your body with warm water from the neck down.  8.  DO NOT shower/wash with your normal soap after using and rinsing off  the CHG Soap.                9.  Pat yourself dry with a clean towel.            10.  Wear clean pajamas.            11.  Place clean sheets on your bed the night of your first shower and do not  sleep with pets. Day of Surgery : Do not apply any lotions/deodorants the morning of surgery.  Please wear clean clothes to the hospital/surgery center.  FAILURE TO FOLLOW THESE INSTRUCTIONS MAY RESULT IN THE CANCELLATION OF YOUR SURGERY PATIENT SIGNATURE_________________________________  NURSE SIGNATURE__________________________________  ________________________________________________________________________   Rogelia Mire  An incentive spirometer is a tool that can help keep your lungs clear and active. This tool measures how well you are filling your lungs with each breath. Taking long deep breaths may help reverse or decrease the chance of developing breathing (pulmonary) problems (especially infection) following:  A long period of time when you are unable to move or be active. BEFORE THE PROCEDURE   If the spirometer includes an indicator to show your best effort, your nurse or respiratory therapist will set it  to a desired goal.  If possible, sit up straight or lean slightly forward. Try not to slouch.  Hold the incentive spirometer in an upright position. INSTRUCTIONS FOR USE  1. Sit on the edge of your bed if possible, or sit up as far as you can in bed or on a chair. 2. Hold the incentive spirometer in an upright position. 3. Breathe out normally. 4. Place the mouthpiece in your mouth and seal your lips tightly around  it. 5. Breathe in slowly and as deeply as possible, raising the piston or the ball toward the top of the column. 6. Hold your breath for 3-5 seconds or for as long as possible. Allow the piston or ball to fall to the bottom of the column. 7. Remove the mouthpiece from your mouth and breathe out normally. 8. Rest for a few seconds and repeat Steps 1 through 7 at least 10 times every 1-2 hours when you are awake. Take your time and take a few normal breaths between deep breaths. 9. The spirometer may include an indicator to show your best effort. Use the indicator as a goal to work toward during each repetition. 10. After each set of 10 deep breaths, practice coughing to be sure your lungs are clear. If you have an incision (the cut made at the time of surgery), support your incision when coughing by placing a pillow or rolled up towels firmly against it. Once you are able to get out of bed, walk around indoors and cough well. You may stop using the incentive spirometer when instructed by your caregiver.  RISKS AND COMPLICATIONS  Take your time so you do not get dizzy or light-headed.  If you are in pain, you may need to take or ask for pain medication before doing incentive spirometry. It is harder to take a deep breath if you are having pain. AFTER USE  Rest and breathe slowly and easily.  It can be helpful to keep track of a log of your progress. Your caregiver can provide you with a simple table to help with this. If you are using the spirometer at home, follow these instructions: SEEK MEDICAL CARE IF:   You are having difficultly using the spirometer.  You have trouble using the spirometer as often as instructed.  Your pain medication is not giving enough relief while using the spirometer.  You develop fever of 100.5 F (38.1 C) or higher. SEEK IMMEDIATE MEDICAL CARE IF:   You cough up bloody sputum that had not been present before.  You develop fever of 102 F (38.9 C) or  greater.  You develop worsening pain at or near the incision site. MAKE SURE YOU:   Understand these instructions.  Will watch your condition.  Will get help right away if you are not doing well or get worse. Document Released: 02/16/2007 Document Revised: 12/29/2011 Document Reviewed: 04/19/2007 [facility] Patient Information 2014 [employer], [place].   ________________________________________________________________________

## 2021-01-23 NOTE — Progress Notes (Addendum)
PCP -Dorinda Hill, MD [facility]     Cardiologist - no  PPM/ICD -  Device Orders -  Rep Notified -   Chest x-ray -  EKG - requested  From [facility] 01-24-21 ON CHART  09 0 Stress Test -  ECHO -  Cardiac Cath -   Sleep Study -  CPAP -   Fasting Blood Sugar -  Checks Blood Sugar __0___ times a day  Blood Thinner Instructions: Aspirin Instructions:  ERAS Protcol - PRE-SURGERY Ensure or G2-   COVID TEST- 4-12  Activity- Able to do house work without SOB and walk to mailbox  Anesthesia review: HTN pre DM BMI 53.38   Patient denies shortness of breath, fever, cough and chest pain at PAT appointment   All instructions explained to the patient, with a verbal understanding of the material. Patient agrees to go over the instructions while at home for a better understanding. Patient also instructed to self quarantine after being tested for COVID-19. The opportunity to ask questions was provided.

## 2021-01-24 ENCOUNTER — Ambulatory Visit (HOSPITAL_COMMUNITY)
Admission: RE | Admit: 2021-01-24 | Discharge: 2021-01-24 | Disposition: A | Discharge status: Home / Self Care | Payer: Medicare Other | Source: Ambulatory Visit | Attending: Orthopedic Surgery | Admitting: Orthopedic Surgery

## 2021-01-24 ENCOUNTER — Encounter (HOSPITAL_COMMUNITY)
Admission: RE | Admit: 2021-01-24 | Discharge: 2021-01-24 | Disposition: A | Discharge status: Home / Self Care | Payer: Medicare Other | Source: Ambulatory Visit | Attending: Orthopedic Surgery | Admitting: Orthopedic Surgery

## 2021-01-24 ENCOUNTER — Encounter (HOSPITAL_COMMUNITY): Payer: Self-pay

## 2021-01-24 ENCOUNTER — Other Ambulatory Visit: Payer: Self-pay

## 2021-01-24 DIAGNOSIS — I700 Atherosclerosis of aorta: Secondary | ICD-10-CM | POA: Diagnosis not present

## 2021-01-24 DIAGNOSIS — Z01811 Encounter for preprocedural respiratory examination: Secondary | ICD-10-CM

## 2021-01-24 DIAGNOSIS — Z01818 Encounter for other preprocedural examination: Secondary | ICD-10-CM | POA: Diagnosis not present

## 2021-01-24 HISTORY — DX: Prediabetes: R73.03

## 2021-01-24 LAB — COMPREHENSIVE METABOLIC PANEL
ALT: 23 U/L (ref 0–44)
AST: 19 U/L (ref 15–41)
Albumin: 3.9 g/dL (ref 3.5–5.0)
Alkaline Phosphatase: 47 U/L (ref 38–126)
Anion gap: 9 (ref 5–15)
BUN: 17 mg/dL (ref 8–23)
CO2: 22 mmol/L (ref 22–32)
Calcium: 9.6 mg/dL (ref 8.9–10.3)
Chloride: 105 mmol/L (ref 98–111)
Creatinine, Ser: 0.72 mg/dL (ref 0.44–1.00)
GFR, Estimated: >60 mL/min (ref >60)
Glucose, Bld: 98 mg/dL (ref 70–99)
Potassium: 3.8 mmol/L (ref 3.5–5.1)
Sodium: 136 mmol/L (ref 135–145)
Total Bilirubin: 1.4 mg/dL — ABNORMAL HIGH (ref 0.3–1.2)
Total Protein: 7.3 g/dL (ref 6.5–8.1)

## 2021-01-24 LAB — CBC WITH DIFFERENTIAL/PLATELET
Abs Immature Granulocytes: 0.01 10*3/uL (ref 0.00–0.07)
Basophils Absolute: 0.1 10*3/uL (ref 0.0–0.1)
Basophils Relative: 1 %
Eosinophils Absolute: 0.3 10*3/uL (ref 0.0–0.5)
Eosinophils Relative: 4 %
HCT: 45.7 % (ref 36.0–46.0)
Hemoglobin: 15 g/dL (ref 12.0–15.0)
Immature Granulocytes: 0 %
Lymphocytes Relative: 28 %
Lymphs Abs: 1.7 10*3/uL (ref 0.7–4.0)
MCH: 29.9 pg (ref 26.0–34.0)
MCHC: 32.8 g/dL (ref 30.0–36.0)
MCV: 91 fL (ref 80.0–100.0)
Monocytes Absolute: 0.5 10*3/uL (ref 0.1–1.0)
Monocytes Relative: 8 %
Neutro Abs: 3.4 10*3/uL (ref 1.7–7.7)
Neutrophils Relative %: 59 %
Platelets: 325 10*3/uL (ref 150–400)
RBC: 5.02 MIL/uL (ref 3.87–5.11)
RDW: 14.1 % (ref 11.5–15.5)
WBC: 5.9 10*3/uL (ref 4.0–10.5)
nRBC: 0 % (ref 0.0–0.2)

## 2021-01-24 LAB — PROTIME-INR
INR: 1.1 (ref 0.8–1.2)
Prothrombin Time: 13.3 seconds (ref 11.4–15.2)

## 2021-01-24 LAB — APTT: aPTT: 35 seconds (ref 24–36)

## 2021-01-24 LAB — SURGICAL PCR SCREEN
MRSA, PCR: NEGATIVE
Staphylococcus aureus: NEGATIVE

## 2021-01-24 LAB — HEMOGLOBIN A1C
Hgb A1c MFr Bld: 5.7 % — ABNORMAL HIGH (ref 4.8–5.6)
Mean Plasma Glucose: 116.89 mg/dL

## 2021-01-25 LAB — COLOGUARD: COLOGUARD: NEGATIVE

## 2021-01-26 ENCOUNTER — Ambulatory Visit
Admission: RE | Admit: 2021-01-26 | Discharge: 2021-01-26 | Disposition: A | Discharge status: Home / Self Care | Payer: Medicare Other | Source: Ambulatory Visit | Attending: Orthopedic Surgery | Admitting: Orthopedic Surgery

## 2021-01-26 DIAGNOSIS — M19012 Primary osteoarthritis, left shoulder: Secondary | ICD-10-CM

## 2021-01-29 ENCOUNTER — Other Ambulatory Visit (HOSPITAL_COMMUNITY)
Admission: RE | Admit: 2021-01-29 | Discharge: 2021-01-29 | Disposition: A | Discharge status: Home / Self Care | Payer: Medicare Other | Source: Ambulatory Visit | Attending: Orthopedic Surgery | Admitting: Orthopedic Surgery

## 2021-01-29 ENCOUNTER — Other Ambulatory Visit: Payer: Self-pay | Admitting: Orthopedic Surgery

## 2021-01-29 DIAGNOSIS — Z20822 Contact with and (suspected) exposure to covid-19: Secondary | ICD-10-CM | POA: Insufficient documentation

## 2021-01-29 DIAGNOSIS — Z01812 Encounter for preprocedural laboratory examination: Secondary | ICD-10-CM | POA: Diagnosis present

## 2021-01-29 LAB — SARS CORONAVIRUS 2 (TAT 6-24 HRS): SARS Coronavirus 2: NEGATIVE

## 2021-01-30 NOTE — Anesthesia Preprocedure Evaluation (Addendum)
Anesthesia Evaluation  Patient identified by MRN, date of birth, ID band Patient awake    Reviewed: Allergy & Precautions, NPO status , Patient's Chart, lab work & pertinent test results  History of Anesthesia Complications Negative for: history of anesthetic complications  Airway Mallampati: II  TM Distance: >3 FB Neck ROM: Full    Dental  (+) Dental Advisory Given, Caps   Pulmonary neg pulmonary ROS,  01/29/2021 SARS coronavirus neg   breath sounds clear to auscultation       Cardiovascular hypertension, Pt. on medications (-) angina Rhythm:Regular Rate:Normal     Neuro/Psych negative neurological ROS     GI/Hepatic Neg liver ROS, GERD  Medicated and Controlled,S/p lap band removal   Endo/Other  Hypothyroidism Morbid obesity  Renal/GU negative Renal ROS     Musculoskeletal  (+) Arthritis , Osteoarthritis,    Abdominal (+) + obese,   Peds  Hematology negative hematology ROS (+)   Anesthesia Other Findings   Reproductive/Obstetrics                            Anesthesia Physical Anesthesia Plan  ASA: III  Anesthesia Plan: General   Post-op Pain Management: GA combined w/ Regional for post-op pain   Induction: Intravenous  PONV Risk Score and Plan: 3 and Dexamethasone and Treatment may vary due to age or medical condition  Airway Management Planned: Oral ETT  Additional Equipment: None  Intra-op Plan:   Post-operative Plan: Extubation in OR  Informed Consent: I have reviewed the patients History and Physical, chart, labs and discussed the procedure including the risks, benefits and alternatives for the proposed anesthesia with the patient or authorized representative who has indicated his/her understanding and acceptance.     Dental advisory given  Plan Discussed with: CRNA and Surgeon  Anesthesia Plan Comments: (Plan routine monitors, GETA with interscalene block for  post op analgesia)       Anesthesia Quick Evaluation

## 2021-01-31 ENCOUNTER — Encounter (HOSPITAL_COMMUNITY)
Admission: RE | Disposition: A | Discharge status: Home / Self Care | Payer: Self-pay | Source: Home / Self Care | Attending: Orthopedic Surgery

## 2021-01-31 ENCOUNTER — Ambulatory Visit (HOSPITAL_COMMUNITY): Payer: Medicare Other | Admitting: Anesthesiology

## 2021-01-31 ENCOUNTER — Ambulatory Visit (HOSPITAL_COMMUNITY)
Admission: RE | Admit: 2021-01-31 | Discharge: 2021-01-31 | Disposition: A | Discharge status: Home / Self Care | Payer: Medicare Other | Attending: Orthopedic Surgery | Admitting: Orthopedic Surgery

## 2021-01-31 ENCOUNTER — Ambulatory Visit (HOSPITAL_COMMUNITY): Payer: Medicare Other

## 2021-01-31 ENCOUNTER — Ambulatory Visit (HOSPITAL_COMMUNITY): Payer: Medicare Other | Admitting: Physician Assistant

## 2021-01-31 DIAGNOSIS — M19012 Primary osteoarthritis, left shoulder: Secondary | ICD-10-CM | POA: Insufficient documentation

## 2021-01-31 DIAGNOSIS — Z79899 Other long term (current) drug therapy: Secondary | ICD-10-CM | POA: Diagnosis not present

## 2021-01-31 DIAGNOSIS — Z881 Allergy status to other antibiotic agents status: Secondary | ICD-10-CM | POA: Diagnosis not present

## 2021-01-31 DIAGNOSIS — Z880 Allergy status to penicillin: Secondary | ICD-10-CM | POA: Insufficient documentation

## 2021-01-31 DIAGNOSIS — Z885 Allergy status to narcotic agent status: Secondary | ICD-10-CM | POA: Insufficient documentation

## 2021-01-31 DIAGNOSIS — Z96651 Presence of right artificial knee joint: Secondary | ICD-10-CM | POA: Insufficient documentation

## 2021-01-31 DIAGNOSIS — Z79890 Hormone replacement therapy (postmenopausal): Secondary | ICD-10-CM | POA: Diagnosis not present

## 2021-01-31 DIAGNOSIS — Z96612 Presence of left artificial shoulder joint: Secondary | ICD-10-CM

## 2021-01-31 DIAGNOSIS — Z886 Allergy status to analgesic agent status: Secondary | ICD-10-CM | POA: Insufficient documentation

## 2021-01-31 HISTORY — PX: TOTAL SHOULDER ARTHROPLASTY: SHX126

## 2021-01-31 LAB — TYPE AND SCREEN
ABO/RH(D): O POS
Antibody Screen: NEGATIVE

## 2021-01-31 SURGERY — ARTHROPLASTY, SHOULDER, TOTAL
Anesthesia: General | Site: Shoulder | Laterality: Left

## 2021-01-31 MED ORDER — HYDROMORPHONE HCL 1 MG/ML IJ SOLN: 0.2500 mg | INTRAMUSCULAR | Status: DC | PRN

## 2021-01-31 MED ORDER — PHENYLEPHRINE HCL (PRESSORS) 10 MG/ML IV SOLN
INTRAVENOUS | Status: AC
  Filled 2021-01-31: qty 1

## 2021-01-31 MED ORDER — OXYCODONE HCL 5 MG PO TABS
5.0000 mg | ORAL_TABLET | Freq: Once | ORAL | Status: DC | PRN
Start: 2021-01-31 — End: 2021-01-31

## 2021-01-31 MED ORDER — CHLORHEXIDINE GLUCONATE 0.12 % MT SOLN
15.0000 mL | Freq: Once | OROMUCOSAL | Status: AC
  Administered 2021-01-31: 15 mL via OROMUCOSAL

## 2021-01-31 MED ORDER — LABETALOL HCL 5 MG/ML IV SOLN
INTRAVENOUS | Status: DC | PRN
  Administered 2021-01-31: 2.5 mg via INTRAVENOUS

## 2021-01-31 MED ORDER — SUGAMMADEX SODIUM 500 MG/5ML IV SOLN
INTRAVENOUS | Status: DC | PRN
  Administered 2021-01-31: 350 mg via INTRAVENOUS

## 2021-01-31 MED ORDER — ORAL CARE MOUTH RINSE: 15.0000 mL | Freq: Once | OROMUCOSAL | Status: AC

## 2021-01-31 MED ORDER — MIDAZOLAM HCL 2 MG/2ML IJ SOLN
INTRAMUSCULAR | Status: AC
  Filled 2021-01-31: qty 2

## 2021-01-31 MED ORDER — BUPIVACAINE LIPOSOME 1.3 % IJ SUSP
INTRAMUSCULAR | Status: DC | PRN
  Administered 2021-01-31: 10 mL via PERINEURAL

## 2021-01-31 MED ORDER — ONDANSETRON HCL 4 MG/2ML IJ SOLN
INTRAMUSCULAR | Status: AC
  Filled 2021-01-31: qty 2

## 2021-01-31 MED ORDER — LIDOCAINE 2% (20 MG/ML) 5 ML SYRINGE
INTRAMUSCULAR | Status: AC
  Filled 2021-01-31: qty 5

## 2021-01-31 MED ORDER — DEXAMETHASONE SODIUM PHOSPHATE 10 MG/ML IJ SOLN
INTRAMUSCULAR | Status: AC
  Filled 2021-01-31: qty 1

## 2021-01-31 MED ORDER — MIDAZOLAM HCL 2 MG/2ML IJ SOLN: 0.5000 mg | Freq: Once | INTRAMUSCULAR | Status: DC | PRN

## 2021-01-31 MED ORDER — TIZANIDINE HCL 4 MG PO TABS: 4.0000 mg | ORAL_TABLET | Freq: Three times a day (TID) | ORAL | 1 refills | Status: AC | PRN

## 2021-01-31 MED ORDER — VANCOMYCIN HCL 1500 MG/300ML IV SOLN
1500.0000 mg | INTRAVENOUS | Status: AC
  Administered 2021-01-31: 1500 mg via INTRAVENOUS
  Filled 2021-01-31: qty 300

## 2021-01-31 MED ORDER — VANCOMYCIN HCL 1000 MG IV SOLR
INTRAVENOUS | Status: AC
  Filled 2021-01-31: qty 1000

## 2021-01-31 MED ORDER — LIDOCAINE 2% (20 MG/ML) 5 ML SYRINGE
INTRAMUSCULAR | Status: DC | PRN
  Administered 2021-01-31: 50 mg via INTRAVENOUS

## 2021-01-31 MED ORDER — PROPOFOL 10 MG/ML IV BOLUS
INTRAVENOUS | Status: AC
  Filled 2021-01-31: qty 20

## 2021-01-31 MED ORDER — MEPERIDINE HCL 50 MG/ML IJ SOLN: 6.2500 mg | INTRAMUSCULAR | Status: DC | PRN

## 2021-01-31 MED ORDER — FENTANYL CITRATE (PF) 250 MCG/5ML IJ SOLN
INTRAMUSCULAR | Status: AC
  Filled 2021-01-31: qty 5

## 2021-01-31 MED ORDER — LACTATED RINGERS IV SOLN: INTRAVENOUS | Status: DC

## 2021-01-31 MED ORDER — ACETAMINOPHEN 500 MG PO TABS
1000.0000 mg | ORAL_TABLET | Freq: Once | ORAL | Status: AC
  Administered 2021-01-31: 1000 mg via ORAL
  Filled 2021-01-31: qty 2

## 2021-01-31 MED ORDER — ROCURONIUM BROMIDE 10 MG/ML (PF) SYRINGE
PREFILLED_SYRINGE | INTRAVENOUS | Status: AC
  Filled 2021-01-31: qty 10

## 2021-01-31 MED ORDER — MIDAZOLAM HCL 5 MG/5ML IJ SOLN
INTRAMUSCULAR | Status: DC | PRN
  Administered 2021-01-31: 2 mg via INTRAVENOUS

## 2021-01-31 MED ORDER — ONDANSETRON HCL 4 MG/2ML IJ SOLN
INTRAMUSCULAR | Status: DC | PRN
  Administered 2021-01-31: 4 mg via INTRAVENOUS

## 2021-01-31 MED ORDER — OXYCODONE HCL 5 MG PO TABS: 5.0000 mg | ORAL_TABLET | Freq: Four times a day (QID) | ORAL | 0 refills | Status: DC | PRN

## 2021-01-31 MED ORDER — TRANEXAMIC ACID-NACL 1000-0.7 MG/100ML-% IV SOLN
1000.0000 mg | INTRAVENOUS | Status: AC
  Administered 2021-01-31: 1000 mg via INTRAVENOUS
  Filled 2021-01-31: qty 100

## 2021-01-31 MED ORDER — FENTANYL CITRATE (PF) 100 MCG/2ML IJ SOLN
INTRAMUSCULAR | Status: DC | PRN
  Administered 2021-01-31: 100 ug via INTRAVENOUS
  Administered 2021-01-31: 50 ug via INTRAVENOUS
  Administered 2021-01-31: 100 ug via INTRAVENOUS
  Administered 2021-01-31: 50 ug via INTRAVENOUS
  Administered 2021-01-31: 100 ug via INTRAVENOUS

## 2021-01-31 MED ORDER — BUPIVACAINE-EPINEPHRINE (PF) 0.5% -1:200000 IJ SOLN
INTRAMUSCULAR | Status: DC | PRN
  Administered 2021-01-31: 10 mL via PERINEURAL

## 2021-01-31 MED ORDER — OXYCODONE HCL 5 MG/5ML PO SOLN: 5.0000 mg | Freq: Once | ORAL | Status: DC | PRN

## 2021-01-31 MED ORDER — PROPOFOL 10 MG/ML IV BOLUS
INTRAVENOUS | Status: DC | PRN
  Administered 2021-01-31: 140 mg via INTRAVENOUS

## 2021-01-31 MED ORDER — SODIUM CHLORIDE 0.9 % IR SOLN
Status: DC | PRN
  Administered 2021-01-31: 3000 mL

## 2021-01-31 MED ORDER — PROPOFOL 500 MG/50ML IV EMUL
INTRAVENOUS | Status: DC | PRN
  Administered 2021-01-31: 25 ug/kg/min via INTRAVENOUS

## 2021-01-31 MED ORDER — DEXAMETHASONE SODIUM PHOSPHATE 10 MG/ML IJ SOLN
INTRAMUSCULAR | Status: DC | PRN
  Administered 2021-01-31: 10 mg via INTRAVENOUS

## 2021-01-31 MED ORDER — PROMETHAZINE HCL 25 MG/ML IJ SOLN: 6.2500 mg | INTRAMUSCULAR | Status: DC | PRN

## 2021-01-31 MED ORDER — ROCURONIUM BROMIDE 10 MG/ML (PF) SYRINGE
PREFILLED_SYRINGE | INTRAVENOUS | Status: DC | PRN
  Administered 2021-01-31: 70 mg via INTRAVENOUS

## 2021-01-31 SURGICAL SUPPLY — 57 items
AID PSTN UNV HD RSTRNT DISP (MISCELLANEOUS) ×1
BIT DRILL 1.6MX128 (BIT) ×2 IMPLANT
BLADE SAW SAG 73X25 THK (BLADE) ×1
BLADE SAW SGTL 73X25 THK (BLADE) ×1 IMPLANT
CEMENT BONE DEPUY (Cement) ×2 IMPLANT
COVER BACK TABLE 60X90IN (DRAPES) ×2 IMPLANT
COVER SURGICAL LIGHT HANDLE (MISCELLANEOUS) ×2 IMPLANT
DRAPE INCISE IOBAN 66X45 STRL (DRAPES) ×2 IMPLANT
DRAPE POUCH INSTRU U-SHP 10X18 (DRAPES) ×2 IMPLANT
DRAPE SURG 17X11 SM STRL (DRAPES) ×2 IMPLANT
DRAPE U-SHAPE 47X51 STRL (DRAPES) ×2 IMPLANT
DRSG AQUACEL AG ADV 3.5X 6 (GAUZE/BANDAGES/DRESSINGS) ×1 IMPLANT
DURAPREP 26ML APPLICATOR (WOUND CARE) ×4 IMPLANT
ELECT BLADE TIP CTD 4 INCH (ELECTRODE) ×2 IMPLANT
ELECT REM PT RETURN 15FT ADLT (MISCELLANEOUS) ×2 IMPLANT
GLENOID PEG SHOULDER 40MM SML (Shoulder) IMPLANT
GLOVE SRG 8 PF TXTR STRL LF DI (GLOVE) ×1 IMPLANT
GLOVE SURG ENC MOIS LTX SZ7 (GLOVE) ×2 IMPLANT
GLOVE SURG ENC MOIS LTX SZ7.5 (GLOVE) ×2 IMPLANT
GLOVE SURG UNDER POLY LF SZ7 (GLOVE) ×2 IMPLANT
GLOVE SURG UNDER POLY LF SZ8 (GLOVE) ×2
GOWN STRL REUS W/TWL LRG LVL3 (GOWN DISPOSABLE) ×2 IMPLANT
GOWN STRL REUS W/TWL XL LVL3 (GOWN DISPOSABLE) ×2 IMPLANT
GUIDEWIRE GLENOID 2.5X220 (WIRE) ×1 IMPLANT
HANDPIECE INTERPULSE COAX TIP (DISPOSABLE) ×2
HEAD HUMERAL AEQUALIS 48X18 (Head) ×2 IMPLANT
HEAD HUMERAL HIGH OS 48X18 (Head) IMPLANT
HOOD PEEL AWAY FLYTE STAYCOOL (MISCELLANEOUS) ×6 IMPLANT
KIT BASIN OR (CUSTOM PROCEDURE TRAY) ×2 IMPLANT
KIT TURNOVER KIT A (KITS) ×2 IMPLANT
MANIFOLD NEPTUNE II (INSTRUMENTS) ×2 IMPLANT
NDL TROCAR POINT SZ 2 1/2 (NEEDLE) ×1 IMPLANT
NEEDLE TROCAR POINT SZ 2 1/2 (NEEDLE) ×2 IMPLANT
NS IRRIG 1000ML POUR BTL (IV SOLUTION) ×2 IMPLANT
PACK SHOULDER (CUSTOM PROCEDURE TRAY) ×2 IMPLANT
PAD COLD SHLDR WRAP-ON (PAD) IMPLANT
RESTRAINT HEAD UNIVERSAL NS (MISCELLANEOUS) ×2 IMPLANT
RETRIEVER SUT HEWSON (MISCELLANEOUS) ×2 IMPLANT
SET HNDPC FAN SPRY TIP SCT (DISPOSABLE) ×1 IMPLANT
SHOULDER GLENOID PEG 40MM SML (Shoulder) ×2 IMPLANT
SLING ARM IMMOBILIZER LRG (SOFTGOODS) ×1 IMPLANT
SMARTMIX MINI TOWER (MISCELLANEOUS) ×2
SPONGE LAP 18X18 RF (DISPOSABLE) ×2 IMPLANT
STEM HUMERAL AEQUALIS 70XS2C (Stem) ×1 IMPLANT
SUCTION FRAZIER HANDLE 12FR (TUBING) ×2
SUCTION TUBE FRAZIER 12FR DISP (TUBING) ×1 IMPLANT
SUPPORT WRAP ARM LG (MISCELLANEOUS) ×2 IMPLANT
SUT ETHIBOND 2 V 37 (SUTURE) ×2 IMPLANT
SUT MNCRL AB 4-0 PS2 18 (SUTURE) ×2 IMPLANT
SUT VIC AB 2-0 CT1 27 (SUTURE) ×2
SUT VIC AB 2-0 CT1 TAPERPNT 27 (SUTURE) ×1 IMPLANT
TAPE LABRALWHITE 1.5X36 (TAPE) ×2 IMPLANT
TAPE STRIPS DRAPE STRL (GAUZE/BANDAGES/DRESSINGS) ×1 IMPLANT
TAPE SUT LABRALTAP WHT/BLK (SUTURE) ×2 IMPLANT
TOWEL OR 17X26 10 PK STRL BLUE (TOWEL DISPOSABLE) ×2 IMPLANT
TOWER SMARTMIX MINI (MISCELLANEOUS) ×1 IMPLANT
WATER STERILE IRR 1000ML POUR (IV SOLUTION) ×3 IMPLANT

## 2021-01-31 NOTE — H&P (Signed)
Kayla [name] is an 65 y.o. female.   Chief Complaint: L shoulder pain and dysfunction HPI: Endstage L shoulder arthritis with significant pain and dysfunction, failed conservative measures.  Pain interferes with sleep and quality of life.   Past Medical History:  Diagnosis Date  . Arthritis   . GERD 11/11/2010  . Heart murmur    as a chld told by mother no other Doctor has told her this  . Hematemesis 11/11/2010  . Hypertension   . Hypothyroidism   . Obesity   . Pre-diabetes   . VASOVAGAL SYNCOPE 11/11/2010    Past Surgical History:  Procedure Laterality Date  . ABDOMINAL HYSTERECTOMY    . CHOLECYSTECTOMY    . KNEE ARTHROSCOPY Left   . LAPAROSCOPIC GASTRIC BANDING    . LAPAROSCOPIC REPAIR AND REMOVAL OF GASTRIC BAND N/A 10/05/2013   Procedure: LAPAROSCOPIC REPAIR AND REMOVAL OF GASTRIC BAND;  Surgeon: Kandis Cocking, MD;  Location: [facility];  Service: General;  Laterality: N/A;  . LUMBAR LAMINECTOMY/DECOMPRESSION MICRODISCECTOMY N/A 12/14/2017   Procedure: Microlumbar decompression L4-5, excision of facet cyst;  Surgeon: Jene Every, MD;  Location: [facility];  Service: Orthopedics;  Laterality: N/A;  120 mins  . 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 UPPER GI ENDOSCOPY  10/05/2013   Procedure: UPPER GI ENDOSCOPY;  Surgeon: Kandis Cocking, MD;  Location: [facility];  Service: General;;    Family History  Problem Relation Age of Onset  . Alzheimer's disease Mother   . Hypertension Mother   . Hypertension Father   . Dementia Father   . Other Father        stomcah surgery with ostomy bag  . Asthma Paternal Grandmother    Social History:  reports that she has never smoked. She has never used smokeless tobacco. She reports that she does not drink alcohol and does not use drugs.  Allergies:  Allergies  Allergen Reactions  . Penicillins Other (See Comments)    Childhood allergy - severe reaction Has patient had a PCN reaction causing immediate rash,  facial/tongue/throat swelling, SOB or lightheadedness with hypotension: yes Has patient had a PCN reaction causing severe rash involving mucus membranes or skin necrosis: no Has patient had a PCN reaction that required hospitalization: yes Has patient had a PCN reaction occurring within the last 10 years: no If all of the above answers are "NO", then may proceed with Cephalosporin use.   Matilde Bash Reductase Inhibitors   . Asa Arthritis Strength-Antacid [Aspirin Buffered] Hives and Swelling  . Aspirin   . Cefaclor Hives  . Morphine Nausea And Vomiting  . Tetracycline Hives  . Vicodin [Hydrocodone-Acetaminophen] Rash    Rash over face, rash and itching over arms. Pt can take tylenol with no problem.    Medications Prior to Admission  Medication Sig Dispense Refill  . acetaminophen (TYLENOL) 500 MG tablet Take 1,000 mg by mouth every 6 (six) hours as needed for mild pain or moderate pain.    . benazepril-hydrochlorthiazide (LOTENSIN HCT) 20-25 MG tablet TAKE 1 TABLET BY MOUTH EVERY DAY FILLABLE (Patient taking differently: Take 1 tablet by mouth daily.) 30 tablet 0  . famotidine (PEPCID) 20 MG tablet Take 20 mg by mouth daily as needed for heartburn or indigestion.    Marland Kitchen levothyroxine (SYNTHROID) 137 MCG tablet Take 137 mcg by mouth daily before breakfast.    . OZEMPIC, 1 MG/DOSE, 4 MG/3ML SOPN Inject 0.5 mg into the skin every 7 (seven)  days.    Mack Guise THYROID 90 MG tablet Take 90 mg twice daily 4 times per week and once daily Monday, Wednesday, Friday. (Patient not taking: Reported on 01/18/2021) 132 tablet 0  . furosemide (LASIX) 40 MG tablet TAKE 1 TABLET BY MOUTH EVERY DAY AS NEEDED FOR FLUID RETENTION (Patient not taking: Reported on 01/18/2021) 90 tablet 1  . thyroid (ARMOUR THYROID) 60 MG tablet Take 1 tablet (60 mg total) by mouth daily before breakfast. (Patient not taking: Reported on 01/18/2021) 90 tablet 4    Results for orders placed or performed during the hospital encounter of  01/29/21 (from the past 48 hour(s))  SARS CORONAVIRUS 2 (TAT 6-24 HRS) Nasopharyngeal Nasopharyngeal Swab     Status: None   Collection Time: 01/29/21  2:58 PM   Specimen: Nasopharyngeal Swab  Result Value Ref Range   SARS Coronavirus 2 NEGATIVE NEGATIVE    Comment: (NOTE) SARS-CoV-2 target nucleic acids are NOT DETECTED.  The SARS-CoV-2 RNA is generally detectable in upper and lower respiratory specimens during the acute phase of infection. Negative results do not preclude SARS-CoV-2 infection, do not rule out co-infections with other pathogens, and should not be used as the sole basis for treatment or other patient management decisions. Negative results must be combined with clinical observations, patient history, and epidemiological information. The expected result is Negative.  Fact Sheet for Patients: HairSlick.no  Fact Sheet for Healthcare Providers: quierodirigir.com  This test is not yet approved or cleared by the [place] FDA and  has been authorized for detection and/or diagnosis of SARS-CoV-2 by FDA under an Emergency Use Authorization (EUA). This EUA will remain  in effect (meaning this test can be used) for the duration of the COVID-19 declaration under Se ction 564(b)(1) of the Act, 21 U.S.C. section 360bbb-3(b)(1), unless the authorization is terminated or revoked sooner.  Performed at [facility], [address] [place], [place] [address]    No results found.  Review of Systems  Blood pressure (!) 165/99, pulse 83, temperature 97.8 F (36.6 C), temperature source Oral, resp. rate 18, weight (!) 168.7 kg, SpO2 96 %. Physical Exam   Assessment/Plan L shoulder endstage osteoarthritis, failed conservative tx. Plan L TSA Risks / benefits of surgery discussed Consent on chart  NPO for OR Preop antibiotics   Berline Lopes, MD 01/31/2021, 7:13 AM

## 2021-01-31 NOTE — Op Note (Signed)
Procedure(s): TOTAL SHOULDER ARTHROPLASTY Procedure Note  Kayla [name] female 65 y.o. 01/31/2021   Preoperative diagnosis: Left shoulder end-stage osteoarthritis  Postoperative diagnosis: Same  Procedure(s) and Anesthesia Type:    Left TOTAL SHOULDER ARTHROPLASTY - General  Surgeon(s) and Role:    Jones Broom, MD - Primary   Indications:  65 y.o. female  With endstage left shoulder arthritis. Pain and dysfunction interfered with quality of life and nonoperative treatment with activity modification, NSAIDS and injections failed.     Surgeon: Berline Lopes   Assistants: Damita Lack PA-C [facility] was present and scrubbed throughout the procedure and was essential in positioning, retraction, exposure, and closure)  Anesthesia: General endotracheal anesthesia with preoperative interscalene block given by attending anesthesiologist     Procedure Detail  TOTAL SHOULDER ARTHROPLASTY  Findings: Tornier flex anatomic press-fit size 2 stem with a 48 head, cemented size small 40 Cortiloc glenoid.   A lesser tuberosity osteotomy was performed and repaired at the conclusion of the procedure.  Estimated Blood Loss:  200 mL         Drains: None   Blood Given: none          Specimens: none        Complications:  * No complications entered in OR log *         Disposition: PACU - hemodynamically stable.         Condition: stable    Procedure:   The patient was identified in the preoperative holding area where I personally marked the operative extremity after verifying with the patient and consent. She  was taken to the operating room where She was transferred to the   operative table.  The patient received an interscalene block in   the holding area by the attending anesthesiologist.  General anesthesia was induced   in the operating room without complication.  The patient did receive IV  Ancef prior to the commencement of the procedure.  The patient was    placed in the beach-chair position with the back raised about 30   degrees.  The nonoperative extremity and head and neck were carefully   positioned and padded protecting against neurovascular compromise.  The   left upper extremity was then prepped and draped in the standard sterile   fashion.    The appropriate operative time-out was performed with   Anesthesia, the perioperative staff, as well as myself and we all agreed   that the left side was the correct operative site.  An approximately   10 cm incision was made from the tip of the coracoid to the center point of the   humerus at the level of the axilla.  Dissection was carried down sharply   through subcutaneous tissues and cephalic vein was identified and taken   laterally with the deltoid.  The pectoralis major was taken medially.  The   upper 1 cm of the pectoralis major was released from its attachment on   the humerus.  The clavipectoral fascia was incised just lateral to the   conjoined tendon.  This incision was carried up to but not into the   coracoacromial ligament.  Digital palpation was used to prove   integrity of the axillary nerve which was protected throughout the   procedure.  Musculocutaneous nerve was not palpated in the operative   field.  Conjoined tendon was then retracted gently medially and the   deltoid laterally.  Anterior circumflex humeral vessels were clamped and  coagulated.  The soft tissues overlying the biceps was incised and this   incision was carried across the transverse humeral ligament to the base   of the coracoid.  The biceps was noted to be severely degenerated. It was released from the superior labrum. The biceps was then tenodesed to the soft tissue just above   pectoralis major and the remaining portion of the biceps superiorly was   excised.  An osteotomy was performed at the lesser tuberosity.  The capsule was then   released all the way down to the 6 o'clock position of the humeral  head.   The humeral head was then delivered with simultaneous adduction,   extension and external rotation.  All humeral osteophytes were removed   and the anatomic neck of the humerus was marked and cut free hand at   approximately 25 degrees retroversion within about 3 mm of the cuff   reflection posteriorly.  The head size was estimated to be a 48 medium   offset.  At that point, the humeral head was retracted posteriorly with   a Fukuda retractor.   Remaining portion of the capsule was released at the base of the   coracoid.  The remaining biceps anchor and the entire anterior-inferior   labrum was excised.  The posterior labrum was also excised but the   posterior capsule was not released.  The guidepin was placed bicortically with non elevated guide.  The reamer was used to ream to concentric bone with punctate bleeding.  This gave an excellent concentric surface.  The center hole was then drilled for an anchor peg glenoid followed by the three peripheral holes and none of the holes   exited the glenoid wall.  I then pulse irrigated these holes and dried   them with Surgicel.  The three peripheral holes were then   pressurized cemented and the anchor peg glenoid was placed and impacted   with an excellent fit.  The glenoid was a 40 small component.  The proximal humerus was then again exposed taking care not to displace the glenoid.    The entry awl was used followed by sounding reamers and then sequentially broached from size 1-2. This was then left in place and the calcar planer was used. Trial head was placed with a 48.  With the trial implantation of the component,  there was approximately 50% posterior translation with immediate snap back to the   anatomic position.  With forward elevation, there was no tendency   towards posterior subluxation.   The trial was removed and the final implant was prepared on a back table.  The trial was removed and the final implant was prepared on a back  table.   3 small holes were drilled on the medial side of the lesser tuberosity osteotomy, through which 2 labral tapes were passed. The implant was then placed through the loop of the 2 labral tapes and impacted with an excellent press-fit. This achieved excellent anatomic reconstruction of the proximal humerus.  The joint was then copiously irrigated with pulse lavage.  The subscapularis and   lesser tuberosity osteotomy were then repaired using the 2 labral tapes previously passed in a double row fashion with horizontal mattress sutures medially brought over through bone tunnels tied over a bone bridge laterally.   One #1 Ethibond was placed at the rotator interval just above   the lesser tuberosity. Copious irrigation was used. Skin was closed with 2-0 Vicryl sutures in the deep  dermal layer and 4-0 Monocryl in a subcuticular  running fashion.  Sterile dressings were then applied including Aquacel.  The patient was placed in a sling and allowed to awaken from general anesthesia and taken to the recovery room in stable condition.      POSTOPERATIVE PLAN:  Early passive range of motion will be allowed with the goal of 0 degrees external rotation and 90 degrees forward elevation.  No internal rotation at this time.  No active motion of the arm until the lesser tuberosity heals.  The patient will be observed in the recovery room and if she has good pain control with her regional block and is hemodynamically stable she could be discharged home today with family.

## 2021-01-31 NOTE — Transfer of Care (Signed)
Immediate Anesthesia Transfer of Care Note  Patient: Kayla [name]  Procedure(s) Performed: TOTAL SHOULDER ARTHROPLASTY (Left Shoulder)  Patient Location: PACU  Anesthesia Type:General  Level of Consciousness:awake cooperative,alert  Airway & Oxygen Therapy: Patient Spontanous Breathing and Patient connected to face mask oxygen  Post-op Assessment: Report given to RN, Post -op Vital signs reviewed and stable and Patient moving all extremities X 4  Post vital signs: stable  Last Vitals:  Vitals Value Taken Time  BP 121/70 01/31/21 0931  Temp 36.7 C 01/31/21 0930  Pulse 83 01/31/21 0936  Resp 10 01/31/21 0936  SpO2 92 % 01/31/21 0936  Vitals shown include unvalidated device data.  Last Pain:  Vitals:   01/31/21 0930  TempSrc:   PainSc: 8          Complications: No complications documented.

## 2021-01-31 NOTE — Evaluation (Signed)
Occupational Therapy Evaluation Patient Details Name: Kayla [name] MRN: [id number] DOB: 1956-01-01 Today's Date: 01/31/2021    History of Present Illness Patient is a 65 year old female s/p L total shoulder arthroplasty. PMH includes R TKA, HTN   Clinical Impression   s/p shoulder replacement without functional use of left  Non-dominant upper extremity secondary to effects of surgery and interscalene block and shoulder precautions. Therapist provided education and instruction to patient and spouse in regards to exercises, precautions, positioning, donning upper extremity clothing and bathing while maintaining shoulder precautions, ice and edema management and donning/doffing sling. Patient and spouse verbalized understanding and demonstrated as needed. Patient needed assistance to donn shirt, underwear, pants, socks and shoes and provided with instruction on compensatory strategies to perform ADLs. Patient to follow up with MD for further therapy needs.      Follow Up Recommendations  Follow surgeon's recommendation for DC plan and follow-up therapies    Equipment Recommendations  None recommended by OT       Precautions / Restrictions Precautions Precautions: Fall;Shoulder Type of Shoulder Precautions: hand wrist elbow rom, sling edu, PROM limited to 90FF and 0 er Shoulder Interventions: Shoulder sling/immobilizer;Off for dressing/bathing/exercises Precaution Booklet Issued: Yes (comment) Required Braces or Orthoses: Sling Restrictions Weight Bearing Restrictions: Yes LUE Weight Bearing: Non weight bearing      Mobility Bed Mobility               General bed mobility comments: in chair    Transfers Overall transfer level: Needs assistance Equipment used: None Transfers: Sit to/from Stand Sit to Stand: Supervision         General transfer comment: S for safety    Balance Overall balance assessment: Mild deficits observed, not formally tested                                          ADL either performed or assessed with clinical judgement   ADL Overall ADL's : Needs assistance/impaired     Grooming: Independent   Upper Body Bathing: Moderate assistance;Sitting;Standing   Lower Body Bathing: Minimal assistance;Sitting/lateral leans;Sit to/from stand   Upper Body Dressing : Maximal assistance;Cueing for UE precautions;Cueing for compensatory techniques;Standing Upper Body Dressing Details (indicate cue type and reason): educated patient and spouse on strategies for don/doff shirt Lower Body Dressing: Minimal assistance;Sitting/lateral leans;Sit to/from stand Lower Body Dressing Details (indicate cue type and reason): to pull clothes up over L hip Toilet Transfer: Supervision/safety;Ambulation   Toileting- Clothing Manipulation and Hygiene: Minimal assistance;Sitting/lateral lean;Sit to/from stand       Functional mobility during ADLs: Supervision/safety                    Pertinent Vitals/Pain Pain Assessment: Faces Faces Pain Scale: Hurts little more Pain Location: L shoulder Pain Descriptors / Indicators: Numbness;Operative site guarding;Heaviness Pain Intervention(s): Monitored during session     Hand Dominance Right   Extremity/Trunk Assessment Upper Extremity Assessment Upper Extremity Assessment: LUE deficits/detail LUE Deficits / Details: + nerve block   Lower Extremity Assessment Lower Extremity Assessment: Overall WFL for tasks assessed   Cervical / Trunk Assessment Cervical / Trunk Assessment: Normal   Communication Communication Communication: No difficulties   Cognition Arousal/Alertness: Awake/alert Behavior During Therapy: WFL for tasks assessed/performed Overall Cognitive Status: Within Functional Limits for tasks assessed  Exercises Exercises: Shoulder   Shoulder Instructions Shoulder Instructions Donning/doffing shirt  without moving shoulder: Maximal assistance;Patient able to independently direct caregiver;Caregiver independent with task Method for sponge bathing under operated UE: Moderate assistance;Caregiver independent with task;Patient able to independently direct caregiver Donning/doffing sling/immobilizer: Caregiver independent with task;Patient able to independently direct caregiver Correct positioning of sling/immobilizer: Caregiver independent with task;Patient able to independently direct caregiver Pendulum exercises (written home exercise program):  (N/A) ROM for elbow, wrist and digits of operated UE: Patient able to independently direct caregiver;Caregiver independent with task Sling wearing schedule (on at all times/off for ADL's): Caregiver independent with task;Patient able to independently direct caregiver Proper positioning of operated UE when showering: Caregiver independent with task;Patient able to independently direct caregiver Positioning of UE while sleeping: Caregiver independent with task;Patient able to independently direct caregiver    Home Living Family/patient expects to be discharged to:: Private residence Living Arrangements: Spouse/significant other Available Help at Discharge: Family Type of Home: House Home Access: Stairs to enter Secretary/administrator of Steps: 1   Home Layout: Two level;Able to live on main level with bedroom/bathroom     Bathroom Shower/Tub: Producer, television/film/video: Handicapped height     Home Equipment: Shower seat - built in;Cane - single point          Prior Functioning/Environment Level of Independence: Independent                 OT Problem List: Pain;Impaired UE functional use;Decreased knowledge of precautions         OT Goals(Current goals can be found in the care plan section) Acute Rehab OT Goals Patient Stated Goal: home OT Goal Formulation: All assessment and education complete, DC therapy   AM-PAC OT "6  Clicks" Daily Activity     Outcome Measure Help from another person eating meals?: None Help from another person taking care of personal grooming?: None Help from another person toileting, which includes using toliet, bedpan, or urinal?: A Little Help from another person bathing (including washing, rinsing, drying)?: A Lot Help from another person to put on and taking off regular upper body clothing?: A Lot Help from another person to put on and taking off regular lower body clothing?: A Little 6 Click Score: 18   End of Session Equipment Utilized During Treatment: Other (comment) (sling) Nurse Communication: Mobility status  Activity Tolerance: Patient tolerated treatment well Patient left: in chair;with call bell/phone within reach;with family/visitor present  OT Visit Diagnosis: Pain Pain - Right/Left: Left Pain - part of body: Shoulder                Time: 4287-6811 OT Time Calculation (min): 40 min Charges:  OT General Charges $OT Visit: 1 Visit OT Evaluation $OT Eval Low Complexity: 1 Low OT Treatments $Self Care/Home Management : 23-37 mins  Marlyce Huge OT OT pager: [phone]   Carmelia Roller 01/31/2021, 12:13 PM

## 2021-01-31 NOTE — Anesthesia Procedure Notes (Signed)
Procedure Name: Intubation Date/Time: 01/31/2021 7:38 AM Performed by: Lissa Morales, CRNA Pre-anesthesia Checklist: Patient identified, Emergency Drugs available, Suction available and Patient being monitored Patient Re-evaluated:Patient Re-evaluated prior to induction Oxygen Delivery Method: Circle system utilized Preoxygenation: Pre-oxygenation with 100% oxygen Induction Type: IV induction Ventilation: Mask ventilation without difficulty Laryngoscope Size: Mac and 4 Grade View: Grade I Tube type: Oral Tube size: 7.5 mm Number of attempts: 1 Airway Equipment and Method: Stylet and Oral airway Placement Confirmation: ETT inserted through vocal cords under direct vision,  positive ETCO2 and breath sounds checked- equal and bilateral Secured at: 22 cm Tube secured with: Tape Dental Injury: Teeth and Oropharynx as per pre-operative assessment

## 2021-01-31 NOTE — Discharge Instructions (Signed)
Regional Anesthesia  Regional anesthesia is a method used to temporarily block feeling in one area of the body. You may have regional anesthesia before a medical procedure or surgery. A health care provider who specializes in giving anesthesia (anesthesiologist) injects a type of medicine near a nerve or a group of nerves. This medicine makes that area of the body numb. Regional anesthesia allows you to be awake during the procedure or surgery but keeps you from feeling pain in the affected area. There are three types of regional anesthesia:  Spinal anesthesia. This is a one-time injection of medicine into the fluid that surrounds your spinal cord. This numbs the area below and slightly above the injection site.  Epidural anesthesia. This is another medicine that may be placed into your back, but just outside of the protective tissue that covers your spinal cord. Instead of a one-time injection, the medicine is often given gradually over time through a small tube (catheter)that remains in your back for as long as pain control is needed.  Peripheral nerve block. This is an injection that is given in an area of the body other than the spine to block all feeling below the injection site. Peripheral nerve blocks may be given as a single injection before your procedure or may be given through a catheter for as long as you need pain control. Regional anesthesia can be used alone or in combination with other types of anesthesia. Compared to using medicine that makes you fall asleep (general anesthetic), regional anesthesia has many benefits, such as:  Improved pain control after your surgery.  Less nausea, vomiting, or drowsiness after surgery.  A faster recovery. Tell a health care provider about:  Any allergies you have.  All medicines you are taking, including vitamins, herbs, eye drops, creams, and over-the-counter medicines.  Any use of drugs, alcohol, or tobacco.  Any problems you or family  members have had with anesthetic medicines.  Any blood disorders you have.  Any surgeries you have had.  Any medical conditions you have or have had, especially heart failure, chronic obstructive pulmonary disease (COPD), or sleep apnea.  Whether you are pregnant or may be pregnant. What are the risks? Generally, this is a safe procedure. However, problems may occur, including:  Pain.  Nausea.  Vomiting.  Itching.  Low blood pressure.  Headache.  Nerve damage.  Infection.  Bleeding around the injection site.  Trouble urinating.  Allergic reactions to medicine. What happens before the procedure? Staying hydrated Follow instructions from your health care provider about hydration, which may include:  Up to 2 hours before the procedure - you may continue to drink clear liquids, such as water, clear fruit juice, black coffee, and plain tea. Eating and drinking restrictions Follow instructions from your health care provider about eating and drinking, which may include:  8 hours before the procedure - stop eating heavy meals or foods, such as meat, fried foods, or fatty foods.  6 hours before the procedure - stop eating light meals or foods, such as toast or cereal.  6 hours before the procedure - stop drinking milk or drinks that contain milk.  2 hours before the procedure - stop drinking clear liquids. Medicines Ask your health care provider about:  Changing or stopping your regular medicines. This is especially important if you are taking diabetes medicines or blood thinners.  Taking medicines such as aspirin and ibuprofen. These medicines can thin your blood. Do not take these medicines unless your health care provider   tells you to take them.  Taking over-the-counter medicines, vitamins, herbs, and supplements. General instructions  Plan to have a responsible adult take you home from the hospital or clinic.  If you will be going home right after the procedure,  plan to have a responsible adult care for you for the time you are told. This is important.  You may need to have blood or imaging tests.  Ask your health care provider what steps will be taken to help prevent infection. These may include washing skin with a germ-killing soap.  If you use a sleep apnea device, ask your health care provider whether you should bring it with you on the day of your surgery. What happens during the procedure?  Depending on the medical procedure you are having done, an IV may be inserted into one of your veins.  The anesthesiologist will do a physical exam to find the best location to give the regional anesthesia. To locate the nerve, he or she may also use: ? A device that activates the nerve and causes your muscles to twitch (nerve stimulator). ? An imaging tool that uses sound waves to create images of the area (ultrasound).  You may be given a medicine to help you relax (sedative).  A medicine called a local anesthetic may be injected to numb the area where the regional anesthetic will be injected.  You will get regional anesthesia by injection or through a catheter.  The anesthesiologist will check to make sure the medicine is working before the rest of your medical procedure begins.  Depending on the type of regional anesthesia you received, you may have a small bandage (dressing) placed over the injection site. The procedure may vary among health care providers and hospitals. What can I expect after the procedure? After your procedure, it is common to have:  Sleepiness.  Nausea.  Itching.  Numbness.  Shivering or feeling cold. Your blood pressure, heart rate, breathing rate, and blood oxygen level will be monitored until you leave the hospital or clinic. Follow these instructions at home:  If you were given a sedative during the procedure, it can affect you for several hours. Do not drive or operate machinery until your health care provider  says that it is safe.  Take over-the-counter and prescription medicines only as told by your health care provider.  Do not drive, exercise, or do any other activities that require coordination as told by your health care provider. Ask your health care provider when you can return to your usual activities.  Drink enough fluid to keep your urine pale yellow.  If you had a dressing placed over the injection site, only remove it when told to do so by your health care provider.  Keep all follow-up visits as told by your health care provider. This is important. Contact a health care provider if you:  Continue to have nausea and vomiting for more than 1 day.  Develop a rash.  Have trouble urinating. Get help right away if you:  Have bleeding from the injection site or bleeding under the skin at the injection site.  Have redness, swelling, or pain around your injection site.  Have a fever.  Develop a headache.  Develop new numbness or weakness. Summary  Regional anesthesia is a method used to temporarily block feeling in one area of the body. It may be done to block pain during a medical procedure or surgery.  Follow instructions from your health care provider about taking medicines and  about eating and drinking before the procedure.  Ask your health care provider when you can return to your usual activities after the procedure. This information is not intended to replace advice given to you by your health care provider. Make sure you discuss any questions you have with your health care provider. Document Revised: 02/03/2020 Document Reviewed: 11/22/2018 [employer] Patient Education  2021 [employer] Discharge Instructions after Reverse Total Shoulder Arthroplasty   . A sling has been provided for you. You are to wear this at all times (except for bathing and dressing), until your first post operative visit with Dr. Ave Filter. Please also wear while sleeping at night. While you bath  and dress, let the arm/elbow extend straight down to stretch your elbow. Wiggle your fingers and pump your first while your in the sling to prevent hand swelling. . Use ice on the shoulder intermittently over the first 48 hours after surgery. Continue to use ice or and ice machine as needed after 48 hours for pain control/swelling.  . Pain medicine has been prescribed for you.  . Use your medicine liberally over the first 48 hours, and then you can begin to taper your use. You may take Extra Strength Tylenol or Tylenol only in place of the pain pills. DO NOT take ANY nonsteroidal anti-inflammatory pain medications: Advil, Motrin, Ibuprofen, Aleve, Naproxen or Naprosyn.  . Take one aspirin a day for 2 weeks after surgery, unless you have an aspirin sensitivity/allergy or asthma.  . Leave your dressing on until your first follow up visit.  You may shower with the dressing.  Hold your arm as if you still have your sling on while you shower. . Simply allow the water to wash over the site and then pat dry. Make sure your axilla (armpit) is completely dry after showering.    Please call [phone] during normal business hours or [phone] after hours for any problems. Including the following:  - excessive redness of the incisions - drainage for more than 4 days - fever of more than 101.5 F  *Please note that pain medications will not be refilled after hours or on weekends.    Dental Antibiotics:  In most cases prophylactic antibiotics for Dental procdeures after total joint surgery are not necessary.  Exceptions are as follows:  1. History of prior total joint infection  2. Severely immunocompromised (Organ Transplant, cancer chemotherapy, Rheumatoid biologic meds such as Humera)  3. Poorly controlled diabetes (A1C &gt; 8.0, blood glucose over 200)  If you have one of these conditions, contact your surgeon for an antibiotic prescription, prior to your dental procedure.  DIET:  As  you were doing prior to hospitalization, we recommend a well-balanced diet.   CONSTIPATION  Constipation is defined medically as fewer than three stools per week and severe constipation as less than one stool per week.  Even if you have a regular bowel pattern at home, your normal regimen is likely to be disrupted due to multiple reasons following surgery.  Combination of anesthesia, postoperative narcotics, change in appetite and fluid intake all can affect your bowels.   YOU MUST use at least one of the following options; they are listed in order of increasing strength to get the job done.  They are all available over the counter, and you may need to use some, POSSIBLY even all of these options:    Drink plenty of fluids (prune juice may be helpful) and high fiber foods Colace 100 mg by mouth twice a day  Senokot for constipation as directed and as needed Dulcolax (bisacodyl), take with full glass of water  Miralax (polyethylene glycol) once or twice a day as needed.  If you have tried all these things and are unable to have a bowel movement in the first 3-4 days after surgery call either your surgeon or your primary doctor.    If you experience loose stools or diarrhea, hold the medications until you stool forms back up.  If your symptoms do not get better within 1 week or if they get worse, check with your doctor.  If you experience "the worst abdominal pain ever" or develop nausea or vomiting, please contact the office immediately for further recommendations for treatment.   ITCHING:  If you experience itching with your medications, try taking only a single pain pill, or even half a pain pill at a time.  You can also use Benadryl over the counter for itching or also to help with sleep.    MEDICATIONS:  See your medication summary on the "After Visit Summary" that nursing will review with you.  You may have some home medications which will be placed on hold until you complete the course of  blood thinner medication.  It is important for you to complete the blood thinner medication as prescribed.  PRECAUTIONS:  If you experience chest pain or shortness of breath - call 911 immediately for transfer to the hospital emergency department.   If you develop a fever greater that 101 F, purulent drainage from wound, increased redness or drainage from wound, foul odor from the wound/dressing, or calf pain - CONTACT YOUR SURGEON.                                                   FOLLOW-UP APPOINTMENTS:  If you do not already have a post-op appointment, please call the office for an appointment to be seen by your surgeon.  Guidelines for how soon to be seen are listed in your "After Visit Summary", but are typically between 1-4 weeks after surgery.   MAKE SURE YOU:  . Understand these instructions.  . Get help right away if you are not doing well or get worse.    Thank you for letting us be a part of your medical care team.  It is a privilege we respect greatly.  We hope these instructions will help you stay on track for a fast and full recovery!

## 2021-01-31 NOTE — Anesthesia Procedure Notes (Signed)
Anesthesia Regional Block: Interscalene brachial plexus block   Pre-Anesthetic Checklist: ,, timeout performed, Correct Patient, Correct Site, Correct Laterality, Correct Procedure, Correct Position, site marked, Risks and benefits discussed,  Surgical consent,  Pre-op evaluation,  At surgeon's request and post-op pain management  Laterality: Left and Upper  Prep: chloraprep       Needles:  Injection technique: Single-shot  Needle Type: Echogenic Needle     Needle Length: 9cm  Needle Gauge: 21     Additional Needles:   Procedures:,,,, ultrasound used (permanent image in chart),,,,  Narrative:  Start time: 01/31/2021 7:11 AM End time: 01/31/2021 7:18 AM Injection made incrementally with aspirations every 5 mL.  Performed by: Personally  Anesthesiologist: Jairo Ben, MD  Additional Notes: Pt identified in Holding room.  Monitors applied. Working IV access confirmed. Sterile prep L clavicle and neck.  #21ga ECHOgenic Arrow block needle to intrascalene brachial plexus with US guidance.  10cc 0.5% Bupivacaine with 1:200k epi, Exparel injected incrementally after negative test dose.  Patient asymptomatic, VSS, no heme aspirated, tolerated well.  Sandford Craze, MD

## 2021-01-31 NOTE — Anesthesia Postprocedure Evaluation (Signed)
Anesthesia Post Note  Patient: DENIKA KRONE  Procedure(s) Performed: TOTAL SHOULDER ARTHROPLASTY (Left Shoulder)     Patient location during evaluation: PACU Anesthesia Type: General Level of consciousness: awake and alert, patient cooperative and oriented Pain management: pain level controlled Vital Signs Assessment: post-procedure vital signs reviewed and stable Respiratory status: spontaneous breathing, nonlabored ventilation and respiratory function stable Cardiovascular status: blood pressure returned to baseline and stable Postop Assessment: no apparent nausea or vomiting, able to ambulate and adequate PO intake Anesthetic complications: no   No complications documented.  Last Vitals:  Vitals:   01/31/21 1030 01/31/21 1045  BP: 124/67 (!) 126/92  Pulse: 86 92  Resp: 18 18  Temp: 36.5 C 36.4 C  SpO2: 92% 92%    Last Pain:  Vitals:   01/31/21 1030  TempSrc:   PainSc: 0-No pain                 Shatona Andujar,E. Becci Batty

## 2021-02-03 ENCOUNTER — Encounter (HOSPITAL_COMMUNITY): Payer: Self-pay | Admitting: Orthopedic Surgery

## 2021-04-03 ENCOUNTER — Ambulatory Visit: Payer: Medicare Other

## 2021-04-03 ENCOUNTER — Other Ambulatory Visit (HOSPITAL_BASED_OUTPATIENT_CLINIC_OR_DEPARTMENT_OTHER): Payer: Self-pay

## 2021-05-21 ENCOUNTER — Emergency Department (HOSPITAL_COMMUNITY)
Admission: EM | Admit: 2021-05-21 | Discharge: 2021-05-21 | Disposition: A | Discharge status: Home / Self Care | Payer: Medicare Other | Attending: Emergency Medicine | Admitting: Emergency Medicine

## 2021-05-21 ENCOUNTER — Emergency Department (HOSPITAL_COMMUNITY): Payer: Medicare Other

## 2021-05-21 ENCOUNTER — Encounter (HOSPITAL_COMMUNITY): Payer: Self-pay

## 2021-05-21 DIAGNOSIS — X501XXA Overexertion from prolonged static or awkward postures, initial encounter: Secondary | ICD-10-CM | POA: Insufficient documentation

## 2021-05-21 DIAGNOSIS — Z96651 Presence of right artificial knee joint: Secondary | ICD-10-CM | POA: Diagnosis not present

## 2021-05-21 DIAGNOSIS — S8002XA Contusion of left knee, initial encounter: Secondary | ICD-10-CM | POA: Insufficient documentation

## 2021-05-21 DIAGNOSIS — Z79899 Other long term (current) drug therapy: Secondary | ICD-10-CM | POA: Diagnosis not present

## 2021-05-21 DIAGNOSIS — I10 Essential (primary) hypertension: Secondary | ICD-10-CM | POA: Insufficient documentation

## 2021-05-21 DIAGNOSIS — E039 Hypothyroidism, unspecified: Secondary | ICD-10-CM | POA: Insufficient documentation

## 2021-05-21 DIAGNOSIS — Z96612 Presence of left artificial shoulder joint: Secondary | ICD-10-CM | POA: Insufficient documentation

## 2021-05-21 DIAGNOSIS — S8992XA Unspecified injury of left lower leg, initial encounter: Secondary | ICD-10-CM | POA: Diagnosis present

## 2021-05-21 DIAGNOSIS — M25562 Pain in left knee: Secondary | ICD-10-CM

## 2021-05-21 MED ORDER — OXYCODONE HCL 5 MG PO TABS
5.0000 mg | ORAL_TABLET | Freq: Once | ORAL | Status: AC
  Administered 2021-05-21: 5 mg via ORAL
  Filled 2021-05-21: qty 1

## 2021-05-21 MED ORDER — ONDANSETRON 4 MG PO TBDP: 4.0000 mg | ORAL_TABLET | Freq: Three times a day (TID) | ORAL | 0 refills | Status: DC | PRN

## 2021-05-21 MED ORDER — OXYCODONE HCL 5 MG PO TABS: 5.0000 mg | ORAL_TABLET | ORAL | 0 refills | Status: DC | PRN

## 2021-05-21 NOTE — Care Management (Addendum)
ED CM attempted to reach the patient via telephone to discuss home health and DME orders. Voicemail left on the patient's primary number requesting a return call.   72 - Patient returned call. CM agrees to home health as ordered. She states she will be attending a follow-up appointment with her orthopedic surgeon tomorrow. Referral for home health sent to [facility] and wheelchair referral sent to [facility].

## 2021-05-21 NOTE — ED Triage Notes (Signed)
Pt presents to the ED for left knee pain from home following turning and pivoting. Pt reports she "felt and heard a tear" in the left knee. Pt has a past hx of left knee surgery for a torn meniscus repair around 15-20 years ago.

## 2021-05-21 NOTE — Discharge Instructions (Addendum)
I have prescribed you a strong narcotic called oxycodone. Please only take this as prescribed. Do not drive or operate heavy machinery after taking this medication. Do not mix it with alcohol.   I am prescribing you a medication called Zofran.  This is a disintegrating tablet you can use up to 3 times a day for management of your nausea and vomiting.  Please only take this as prescribed.  Please only take this if you are experiencing nausea and vomiting that you cannot control.   Please call Dr. Ermelinda Das office tomorrow to schedule a follow-up appointment.  Social work will coordinate with you on delivering of a wheelchair, bedside commode, and shower seat.  If you develop any new or worsening symptoms please come back to the emergency department.  It was a pleasure to meet you.

## 2021-05-21 NOTE — ED Provider Notes (Signed)
[facility]-EMERGENCY DEPT Provider Note   CSN: [id number] Arrival date & time: 05/21/21  1336     History Chief Complaint  Patient presents with   left knee pain    Kayla [name] is a 65 y.o. female.  HPI Patient is a 65 year old female with a medical history as noted below.  She presents to the emergency department due to left knee pain that began prior to arrival.  Patient reports a history of meniscal surgery in the left knee 15 to 20 years ago.  She states that she was sitting in a chair eating breakfast and pivoted the left leg beneath the table and felt and heard a tear in the knee.  She began experiencing exquisite pain along the anterior knee with swelling and bruising.  She states that she has not been able to ambulate on the leg since this occurred.  Denies any falls or other regions of pain.  No head trauma.    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 10/04/2013   Complication of gastric banding 10/04/2013   Dysphagia, unspecified(787.20) 09/30/2013   Allergic rhinitis 06/14/2012   Post-operative state 08/20/2011   Obesity 08/20/2011   High blood pressure 08/18/2011   GERD 11/11/2010   VASOVAGAL SYNCOPE 11/11/2010    Past Surgical History:  Procedure Laterality Date   ABDOMINAL HYSTERECTOMY     CHOLECYSTECTOMY     KNEE ARTHROSCOPY Left    LAPAROSCOPIC GASTRIC BANDING     LAPAROSCOPIC REPAIR AND REMOVAL OF GASTRIC BAND N/A 10/05/2013   Procedure:  LAPAROSCOPIC REPAIR AND REMOVAL OF GASTRIC BAND;  Surgeon: Kandis Cocking, MD;  Location: [facility];  Service: General;  Laterality: N/A;   LUMBAR LAMINECTOMY/DECOMPRESSION MICRODISCECTOMY N/A 12/14/2017   Procedure: Microlumbar decompression L4-5, excision of facet cyst;  Surgeon: Jene Every, MD;  Location: [facility];  Service: Orthopedics;  Laterality: N/A;  120 mins   TOTAL KNEE ARTHROPLASTY Right 10 2012   right  bean   TOTAL SHOULDER ARTHROPLASTY Left 01/31/2021   Procedure: TOTAL SHOULDER ARTHROPLASTY;  Surgeon: Jones Broom, MD;  Location: [facility];  Service: Orthopedics;  Laterality: Left;   UPPER GI ENDOSCOPY  10/05/2013   Procedure: UPPER GI ENDOSCOPY;  Surgeon: Kandis Cocking, MD;  Location: [facility];  Service: General;;     OB History   No obstetric history on file.     Family History  Problem Relation Age of Onset   Alzheimer's disease Mother    Hypertension Mother    Hypertension Father    Dementia Father    Other Father        stomcah surgery with ostomy bag   Asthma Paternal Grandmother     Social History   Tobacco Use   Smoking status: Never   Smokeless tobacco: Never  Vaping Use   Vaping Use: Never used  Substance Use Topics   Alcohol use: No  Drug use: No    Home Medications Prior to Admission medications   Medication Sig Start Date End Date Taking? Authorizing Provider  ondansetron (ZOFRAN ODT) 4 MG disintegrating tablet Take 1 tablet (4 mg total) by mouth every 8 (eight) hours as needed for nausea or vomiting. 05/21/21  Yes Placido Sou, PA-C  oxyCODONE (ROXICODONE) 5 MG immediate release tablet Take 1 tablet (5 mg total) by mouth every 4 (four) hours as needed for severe pain. 05/21/21  Yes Placido Sou, PA-C  acetaminophen (TYLENOL) 500 MG tablet Take 1,000 mg by mouth every 6 (six) hours as needed for mild pain or moderate pain.    [provider]  ARMOUR THYROID 90 MG tablet Take 90 mg twice daily 4 times per week and once daily Monday,  Wednesday, Friday. Patient not taking: Reported on 01/18/2021 03/30/18   Gordy Savers, MD  benazepril-hydrochlorthiazide (LOTENSIN HCT) 20-25 MG tablet TAKE 1 TABLET BY MOUTH EVERY DAY FILLABLE Patient taking differently: Take 1 tablet by mouth daily. 09/30/18   Philip Aspen, Limmie Patricia, MD  famotidine (PEPCID) 20 MG tablet Take 20 mg by mouth daily as needed for heartburn or indigestion.    [provider]  furosemide (LASIX) 40 MG tablet TAKE 1 TABLET BY MOUTH EVERY DAY AS NEEDED FOR FLUID RETENTION Patient not taking: Reported on 01/18/2021 05/25/18   Gordy Savers, MD  levothyroxine (SYNTHROID) 137 MCG tablet Take 137 mcg by mouth daily before breakfast.    [provider]  OZEMPIC, 1 MG/DOSE, 4 MG/3ML SOPN Inject 0.5 mg into the skin every 7 (seven) days. 01/10/21   [provider]  thyroid (ARMOUR THYROID) 60 MG tablet Take 1 tablet (60 mg total) by mouth daily before breakfast. Patient not taking: Reported on 01/18/2021 06/16/18   Gordy Savers, MD  tiZANidine (ZANAFLEX) 4 MG tablet Take 1 tablet (4 mg total) by mouth every 8 (eight) hours as needed for muscle spasms. 01/31/21   Jiles Harold, PA-C    Allergies    Penicillins, 5-alpha reductase inhibitors, Asa arthritis strength-antacid [aspirin buffered], Aspirin, Cefaclor, Morphine, Tetracycline, and Vicodin [hydrocodone-acetaminophen]  Review of Systems   Review of Systems  Musculoskeletal:  Positive for arthralgias and joint swelling.  Skin:  Positive for color change. Negative for wound.  Neurological:  Negative for numbness.   Physical Exam Updated Vital Signs BP 136/87 (BP Location: Right Arm)   Pulse 80   Temp 98 F (36.7 C) (Oral)   Ht 5\' 11"  (1.803 m)   Wt (!) 154.2 kg   SpO2 96%   BMI 47.42 kg/m   Physical Exam Vitals and nursing note reviewed.  Constitutional:      General: She is not in acute distress.    Appearance: Normal appearance. She is obese. She is not  ill-appearing, toxic-appearing or diaphoretic.  HENT:     Head: Normocephalic and atraumatic.     Right Ear: External ear normal.     Left Ear: External ear normal.     Nose: Nose normal.     Mouth/Throat:     Mouth: Mucous membranes are moist.     Pharynx: Oropharynx is clear. No oropharyngeal exudate or posterior oropharyngeal erythema.  Eyes:     Extraocular Movements: Extraocular movements intact.  Cardiovascular:     Rate and Rhythm: Normal rate and regular rhythm.     Pulses: Normal pulses.     Heart sounds: Normal heart sounds. No murmur heard.   No friction rub. No gallop.  Pulmonary:     Effort: Pulmonary effort is normal. No respiratory distress.     Breath sounds: Normal breath sounds. No stridor. No wheezing, rhonchi or rales.  Abdominal:     General: Abdomen is flat.     Tenderness: There is no abdominal tenderness.  Musculoskeletal:        General: Swelling and tenderness present. Normal range of motion.     Cervical back: Normal range of motion and neck supple. No tenderness.     Comments: Difficult to assess due to body habitus.  Exquisite tenderness noted overlying the left patella.  Unable to assess for laxity or range of motion due to patient's pain.  Developing ecchymosis noted along the medial aspect of the joint.  2+ pedal pulses.  Skin:    General: Skin is warm and dry.     Findings: Bruising present.  Neurological:     General: No focal deficit present.     Mental Status: She is alert and oriented to person, place, and time.  Psychiatric:        Mood and Affect: Mood normal.        Behavior: Behavior normal.   ED Results / Procedures / Treatments   Labs (all labs ordered are listed, but only abnormal results are displayed) Labs Reviewed - No data to display  EKG None  Radiology DG Knee Complete 4 Views Left  Result Date: 05/21/2021 CLINICAL DATA:  Left knee pain EXAM: LEFT KNEE - COMPLETE 4+ VIEW COMPARISON:  None. FINDINGS: No acute fracture or  dislocation. Old corticated ossicle seen adjacent to the medial condyle, likely a sequela prior MCL injury. Moderate tricompartmental degenerative changes consisting of joint space loss and osteophyte formation. Small joint effusion. Soft tissues are unremarkable. IMPRESSION: Moderate tricompartmental degenerative changes. Electronically Signed   By: Allegra LaiLeah  Strickland MD   On: 05/21/2021 15:08    Procedures Procedures   Medications Ordered in ED Medications  oxyCODONE (Oxy IR/ROXICODONE) immediate release tablet 5 mg (5 mg Oral Given 05/21/21 1541)  oxyCODONE (Oxy IR/ROXICODONE) immediate release tablet 5 mg (5 mg Oral Given 05/21/21 1830)    ED Course  I have reviewed the triage vital signs and the nursing notes.  Pertinent labs & imaging results that were available during my care of the patient were reviewed by me and considered in my medical decision making (see chart for details).  Clinical Course as of 05/21/21 2005  Tue May 21, 2021  1657 Patient prefers that I discussed her case with [facility].  She has worked with Dr. Shelle IronBeane in the past.  I spoke to Dr[facility] Aund[facility] Rud[facility]ers [facility]  Does not feel that additional imaging is necessary at this time.  Given her body habitus does not feel that a knee immobilizer is reasonable.  Recommended having her follow-up outpatient with Dr. Jillyn HiddenBean.  Patient discussed with social work who has spoken to the patient.  Patient would not allow us to work on finding her a SNF facility.  They are going to work on delivering a wheelchair, shower seat, and bedside commode to the home. [LJ]    Clinical Course User Index [LJ] Placido SouJoldersma, Leata Dominy, PA-C   MDM Rules/Calculators/A&P                          Pt is a 65 y.o. female who presents to the emergency department due to sudden onset left knee pain that occurred prior to arrival.  Imaging: X-ray  of the left knee shows moderate tricompartmental degenerative changes.  I, Placido Sou, PA-C,  personally reviewed and evaluated these images and lab results as part of my medical decision-making.  Patient having exquisite tenderness along the left patella with a region of developing ecchymosis to the medial aspect of the knee.  Unable to assess for laxity or range of motion of the joint due to of the severity of her pain.  She is neurovascularly intact in the extremity distal to the knee.  Patient is followed by Dr. Shelle Iron with [facility] and she requested that we discussed her case with [facility].  Patient discussed with Dr. Aundria Rud who is on-call.  He did not feel that additional imaging was warranted.  Due to her body habitus did not feel that she would tolerate a knee immobilizer as well.  Patient cannot bear weight on the left leg and this combined with her age and body habitus will make it difficult for her to complete her ADLs.  She was offered social work consult and possible SNF placement but she declined.  Social work consult was instead placed and orders were put in for home health as well as for a wheelchair and bedside commode.  Patient was given 2 doses of oxycodone in the emergency department and she notes significant relief of her pain.  We will give her an additional prescription for this medication.  We discussed safety regarding its use.  Given additional prescription for Zofran as well.  Patient is planning on following up with Dr. Shelle Iron first thing tomorrow morning.  We discussed return precautions in length.  Feel that she is stable for discharge at this time and she is agreeable.  Her husband is at bedside and is going to take her home.  Her questions were answered and they were amicable at the time of discharge.  Note: Portions of this report may have been transcribed using voice recognition software. Every effort was made to ensure accuracy; however, inadvertent computerized transcription errors may be present.   Final Clinical Impression(s) / ED Diagnoses Final  diagnoses:  Acute pain of left knee    Rx / DC Orders ED Discharge Orders          Ordered    For home use only DME standard manual wheelchair with seat cushion       Comments: Patient suffers from left knee pain which impairs their ability to perform daily activities like bathing in the home.  A walker will not resolve issue with performing activities of daily living. A wheelchair will allow patient to safely perform daily activities. Patient can safely propel the wheelchair in the home or has a caregiver who can provide assistance. Length of need 6 months . Accessories: elevating leg rests (ELRs), wheel locks, extensions and anti-tippers.   05/21/21 1812    For home use only DME 3 n 1        05/21/21 1814    oxyCODONE (ROXICODONE) 5 MG immediate release tablet  Every 4 hours PRN        05/21/21 1853    ondansetron (ZOFRAN ODT) 4 MG disintegrating tablet  Every 8 hours PRN        05/21/21 1853    Ambulatory referral to Home Health       Comments: Please evaluate Kayla [name] for admission to Home Health.  Disciplines requested: Nursing and Physical Therapy  Services to provide: Other: PT and help with ADLs  Physician to follow patient's care (the person  listed here will be responsible for signing ongoing orders): PCP  Requested Start of Care Date: Tomorrow  I certify that this patient is under my care and that I, or a Nurse Practitioner or Physician Assistant working with me, had a face-to-face encounter that meets the physician face-to-face requirements with patient on 05/21/2021. The encounter with the patient was in whole, or in part for the following medical condition(s) which is the primary reason for home health care (List medical condition). Injury to left knee and inability to ambulate or perform ADLs  Special Instructions:  N/a   05/21/21 1926             Placido Sou, PA-C 05/21/21 2008    Ernie Avena, MD 05/22/21 [phone]

## 2021-05-21 NOTE — Progress Notes (Signed)
.  Transition of Care [facility]) - Emergency Department Mini Assessment   Patient Details  Name: Kayla [name] MRN: [id number] Date of Birth: Aug 19, 1956  Transition of Care [facility]) CM/SW Contact:    Larrie Kass, LCSW Phone Number: 05/21/2021,    Clinical Narrative: Pt reported to ED with Knee pain. Pt reported she lives with her husband in a single family home. She reported she can not do anything for her self due to the knee pain. Pt reported her husband helps her out sometimes but is unable to lifted her due to back problems he has. Pt stated she does not go up any stairs in the home, but there are two steps outside that leads inside the home. Pt stated her bathroom is next to her bedroom but it is not handclap assessable. Pt stated she does not want to go to a SNF, this would be the last resort per pt. CSW informed pt that the medical work will need to be completed to further discuss d/c planing. CSW informed pt she will be able to receive an wheel chair , shower seat , and bedside commode at pt's requested if she is d/c home. TOC CSW continue to follow.   Valentina Shaggy.Aldona Bryner, MSW, LCSWA [facility]  Transitions of Care Clinical Social Worker I Direct Dial: [phone]  Fax: [phone] [email or web address]   ED Mini Assessment:    Barriers to Discharge: Continued Medical Work up             Patient Contact and Communications        ,                 Admission diagnosis:  knee injury Patient Active Problem List   Diagnosis Date Noted   Hypothyroidism 03/25/2018   Synovial cyst of lumbar facet joint 12/14/2017   Spinal stenosis of lumbar region 12/14/2017   Spinal stenosis at L4-L5 level 12/14/2017   Lapband APS October 2008 - removed Dec 2014. 10/04/2013   Complication of gastric banding 10/04/2013   Dysphagia, unspecified(787.20) 09/30/2013   Allergic rhinitis 06/14/2012   Post-operative state 08/20/2011   Obesity  08/20/2011   High blood pressure 08/18/2011   GERD 11/11/2010   VASOVAGAL SYNCOPE 11/11/2010   PCP:  Melida Quitter, MD Pharmacy:   [id number] [place], [place] - [address] [place] [address] Phone: [phone] Fax: [phone]

## 2021-10-22 ENCOUNTER — Ambulatory Visit
Admission: RE | Admit: 2021-10-22 | Discharge: 2021-10-22 | Disposition: A | Discharge status: Home / Self Care | Payer: Medicare Other | Source: Ambulatory Visit | Attending: Family Medicine | Admitting: Family Medicine

## 2021-10-22 ENCOUNTER — Other Ambulatory Visit: Payer: Self-pay | Admitting: Family Medicine

## 2021-10-22 DIAGNOSIS — M25551 Pain in right hip: Secondary | ICD-10-CM

## 2021-10-30 ENCOUNTER — Other Ambulatory Visit (HOSPITAL_COMMUNITY): Payer: Self-pay

## 2021-10-30 MED ORDER — OZEMPIC (2 MG/DOSE) 8 MG/3ML ~~LOC~~ SOPN
2.0000 mg | PEN_INJECTOR | SUBCUTANEOUS | 6 refills | Status: DC
  Filled 2021-10-30: qty 3, 28d supply, fill #0

## 2022-03-11 IMAGING — CT CT HIP*R* W/O CM
1 series · 16 of 32 positions shown, 20 images · non-contrast
Comparison: None.

CLINICAL DATA: Right hip pain after working out. History of
arthritis.

EXAM:
CT OF THE RIGHT HIP WITHOUT CONTRAST
TECHNIQUE: Multidetector CT imaging of the right hip was performed according to
the standard protocol. Multiplanar CT image reconstructions were
also generated.

[Series 3: soft tissue pelvis/hip · axial · 0.59mm/px · z∈[+494,+740]mm · 16 of 91 slices shown, 20 images]
[im 6/91  soft-tissue]
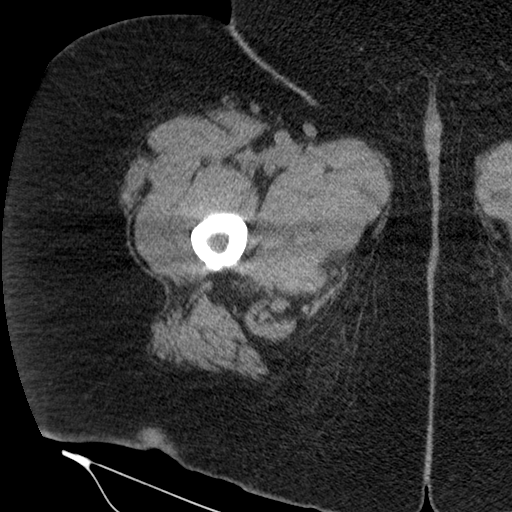
[im 6/91  bone]
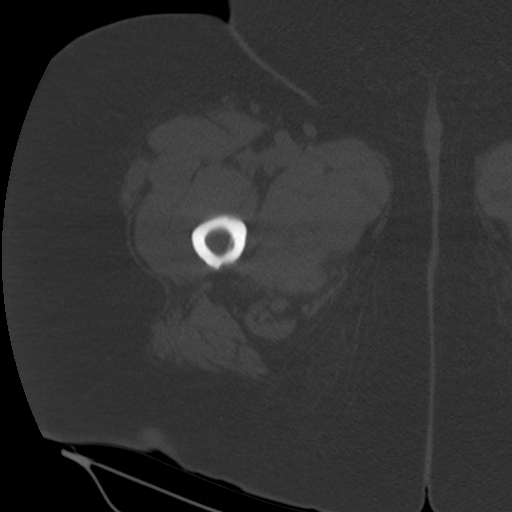
[im 12/91  soft-tissue]
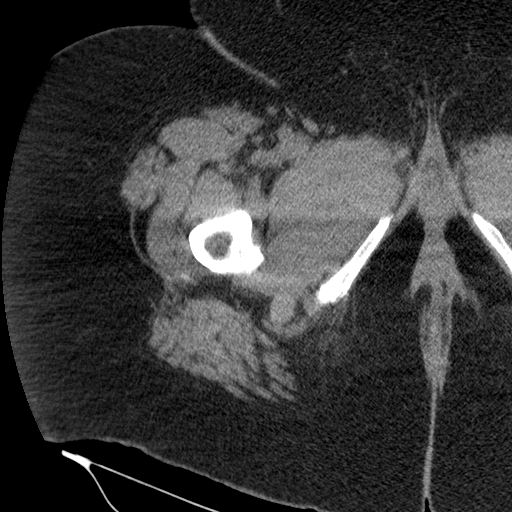
[im 18/91  soft-tissue]
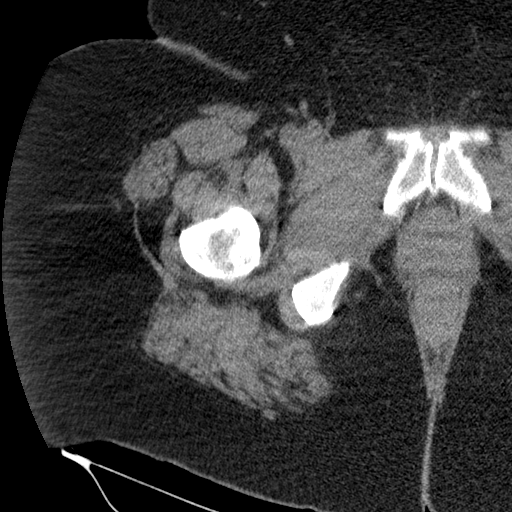
[im 24/91  soft-tissue]
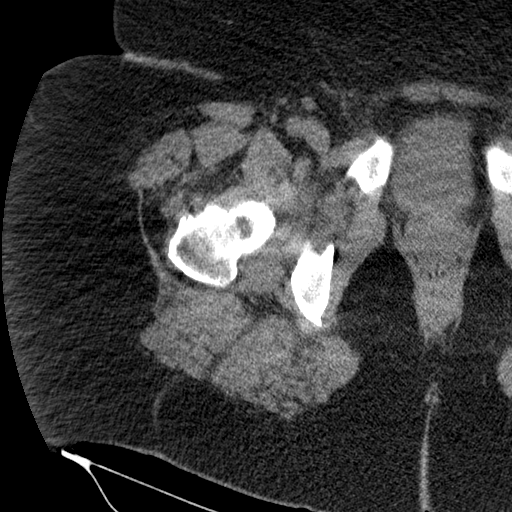
[im 30/91  soft-tissue]
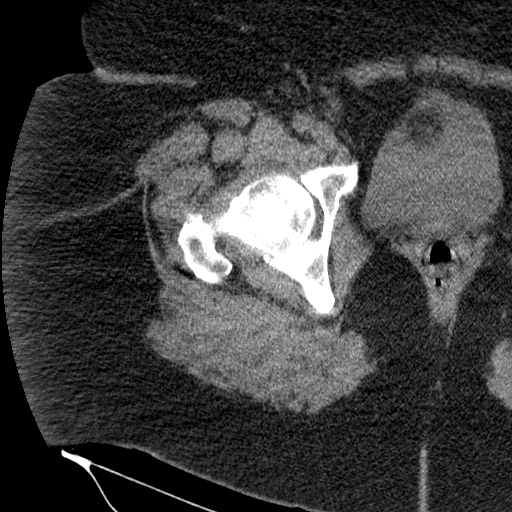
[im 35/91  soft-tissue]
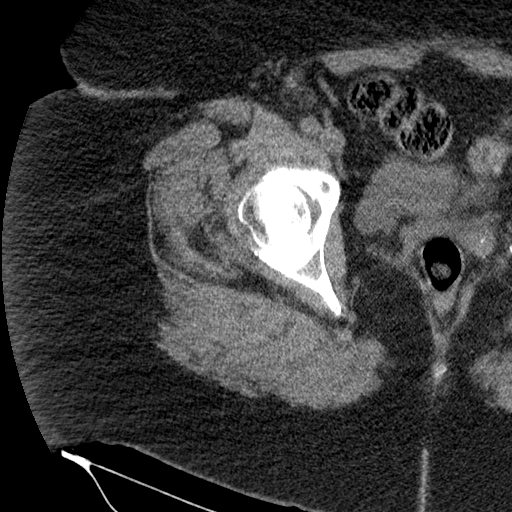
[im 41/91  soft-tissue]
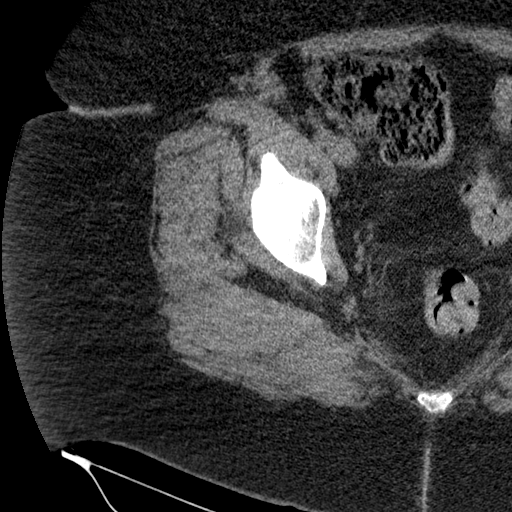
[im 50/91  soft-tissue]
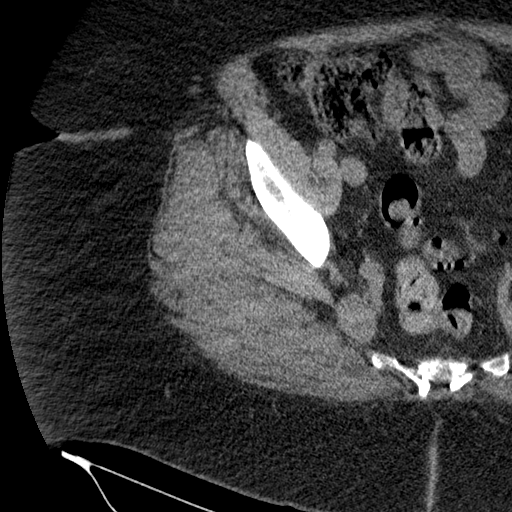
[im 56/91  soft-tissue]
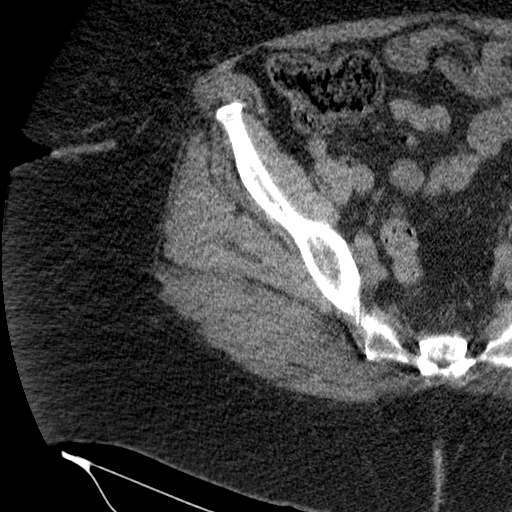
[im 56/91  bone]
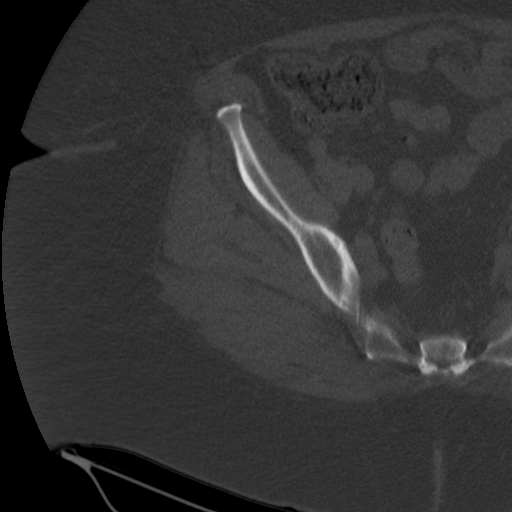
[im 61/91  soft-tissue]
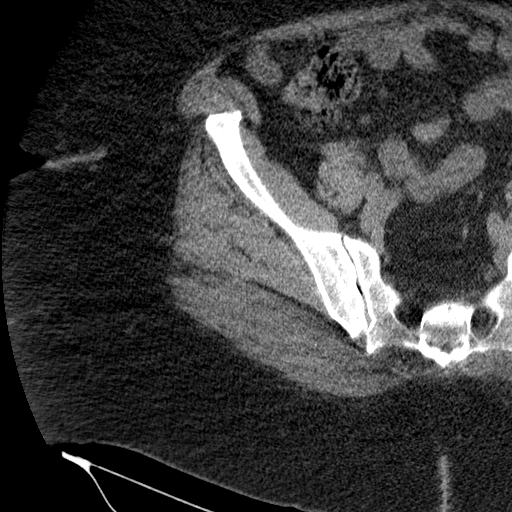
[im 67/91  soft-tissue]
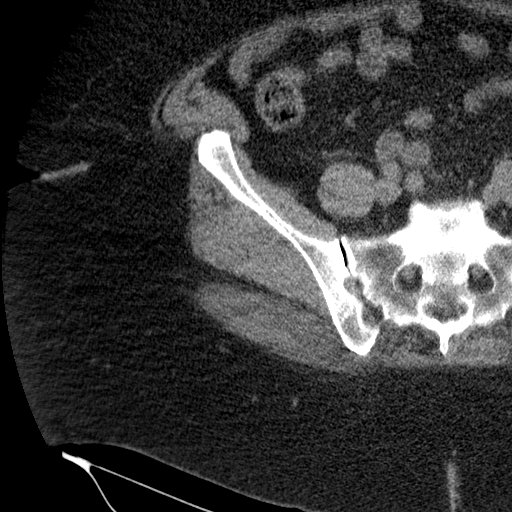
[im 73/91  soft-tissue]
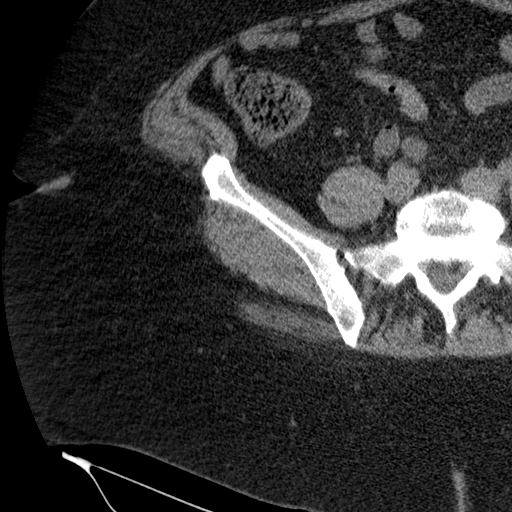
[im 79/91  soft-tissue]
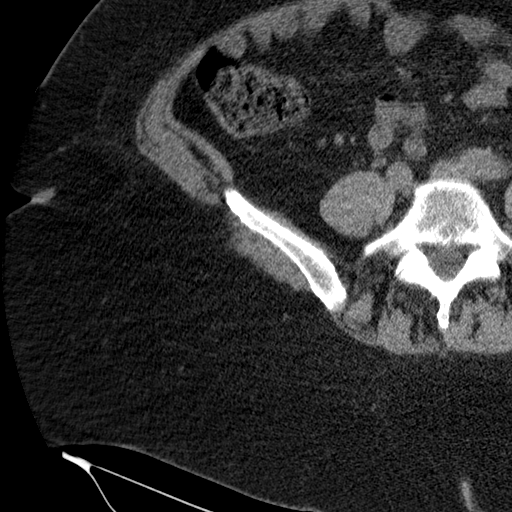
[im 79/91  lung]
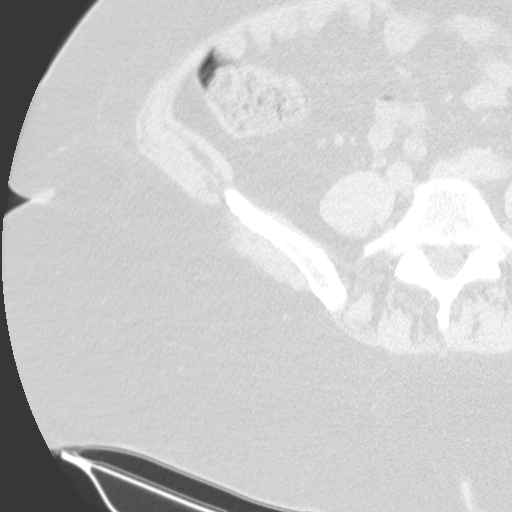
[im 82/91  lung]
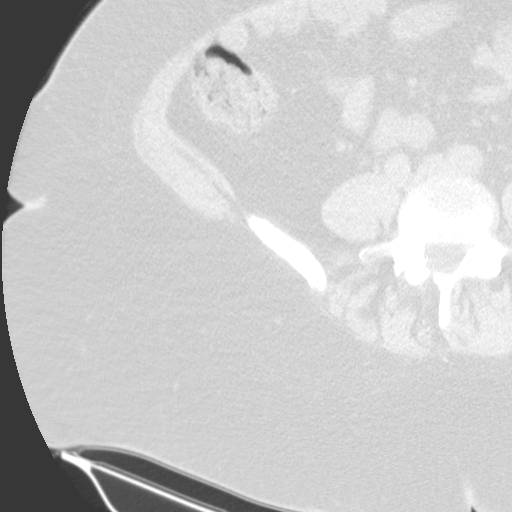
[im 85/91  soft-tissue]
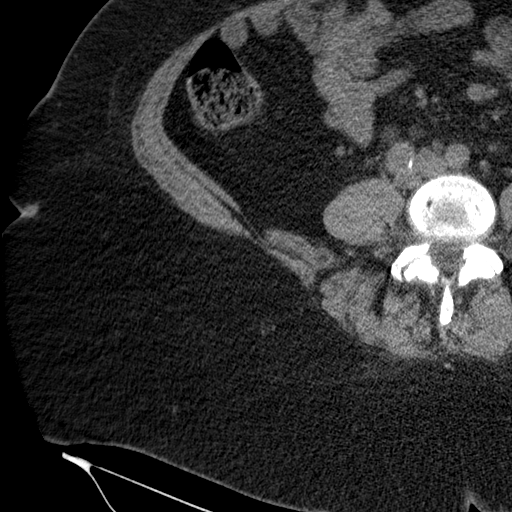
[im 85/91  lung]
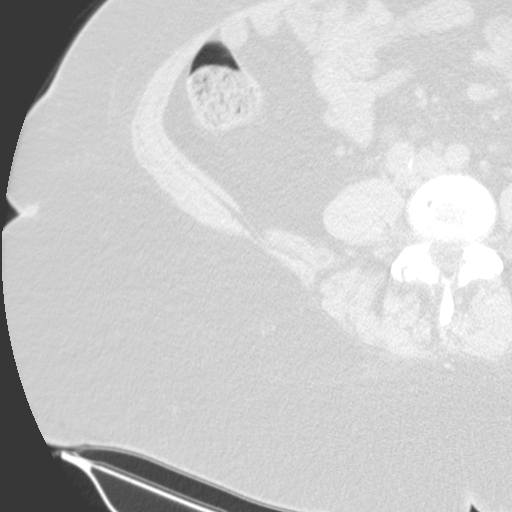
[im 88/91  lung]
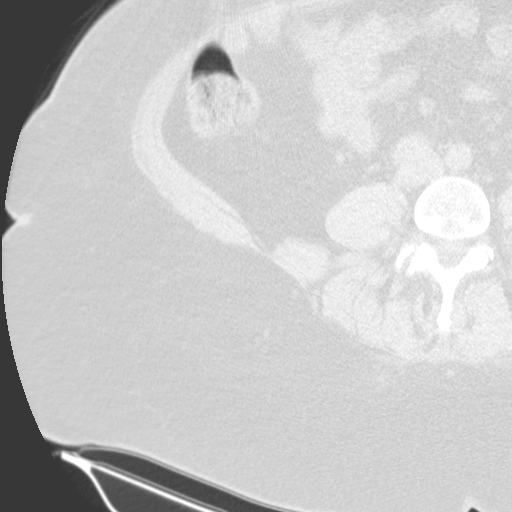

[16 of 32 positions shown; findings below may reference images not displayed]

FINDINGS: Bones/Joint/Cartilage

No evidence of fracture or dislocation. No significant arthropathy.
Degenerate disc disease of the lumbar spine prominent at L5-S1.
There is vacuum phenomena at right sacroiliac joint.

Ligaments

Suboptimally assessed by CT.

Muscles and Tendons

Normal in bulk and density. No tendinopathy. No evidence of hematoma
or collection.

Soft tissues

Skin and subcutaneous soft tissues are within normal limits.
IMPRESSION: No evidence of fracture or dislocation. No significant hip
arthropathy.

Degenerative disease of the lumbar spine.

## 2023-05-27 ENCOUNTER — Other Ambulatory Visit (HOSPITAL_BASED_OUTPATIENT_CLINIC_OR_DEPARTMENT_OTHER): Payer: Self-pay

## 2023-05-27 ENCOUNTER — Other Ambulatory Visit (HOSPITAL_COMMUNITY): Payer: Self-pay

## 2023-05-27 MED ORDER — MOUNJARO 12.5 MG/0.5ML ~~LOC~~ SOAJ
12.5000 mg | SUBCUTANEOUS | 3 refills | Status: AC
  Filled 2023-05-27 (×2): qty 2, 28d supply, fill #0
  Filled 2023-06-19: qty 2, 28d supply, fill #1

## 2023-06-19 ENCOUNTER — Other Ambulatory Visit: Payer: Self-pay

## 2023-06-19 ENCOUNTER — Other Ambulatory Visit (HOSPITAL_BASED_OUTPATIENT_CLINIC_OR_DEPARTMENT_OTHER): Payer: Self-pay

## 2023-11-13 ENCOUNTER — Other Ambulatory Visit (HOSPITAL_BASED_OUTPATIENT_CLINIC_OR_DEPARTMENT_OTHER): Payer: Self-pay

## 2024-03-08 ENCOUNTER — Other Ambulatory Visit: Payer: Self-pay

## 2024-03-09 LAB — COLOGUARD: COLOGUARD: NEGATIVE

## 2024-09-12 NOTE — Progress Notes (Unsigned)
 Office: [phone]  /  Fax: [phone]   Initial Visit    Adrien LITTIE Fair was seen in clinic today to evaluate for obesity. She is interested in losing weight to improve overall health and reduce the risk of weight related complications. She presents today to review program treatment options, initial physical assessment, and evaluation.     She was referred by: {emreferby:28303}  When asked what else they would like to accomplish? She states: {EMHopetoaccomplish:28304::Adopt a healthier eating pattern and lifestyle,Improve energy levels and physical activity,Improve existing medical conditions,Improve quality of life}  When asked how has your weight affected you? She states: {EMWeightAffected:28305}  Weight history: ***  Highest weight: ***  Some associated conditions: {EMSomeConditions:28306}  Contributing factors: {EMcontributingfactors:28307}  Weight promoting medications identified: {EMWeightpromotingrx:28308}  Prior weight loss attempts: {emweightlossprograms:31590::None}  Current nutrition plan: {EMNutritionplan:28309::None}  Current level of physical activity: {EMcurrentPA:28310::None}  Current or previous pharmacotherapy: {EM previousRx:28311}  Response to medication: {EMResponsetomedication:28312}   Past medical history includes:   Past Medical History:  Diagnosis Date   Arthritis    GERD 11/11/2010   Heart murmur    as a chld told by mother no other Doctor has told her this   Hematemesis 11/11/2010   Hypertension    Hypothyroidism    Obesity    Pre-diabetes    VASOVAGAL SYNCOPE 11/11/2010     Objective    There were no vitals taken for this visit. She was weighed on the bioimpedance scale: There is no height or weight on file to calculate BMI.  Body Fat%:***, Visceral Fat Rating:***, Weight trend over the last 12 months: {emweighttrend:28333}  General:  Alert, oriented and cooperative. Patient is in no acute distress.  Respiratory:  Normal respiratory effort, no problems with respiration noted   Gait: able to ambulate independently  Mental Status: Normal mood and affect. Normal behavior. Normal judgment and thought content.   DIAGNOSTIC DATA REVIEWED:  BMET    Component Value Date/Time   NA 136 01/24/2021 0855   K 3.8 01/24/2021 0855   CL 105 01/24/2021 0855   CO2 22 01/24/2021 0855   GLUCOSE 98 01/24/2021 0855   BUN 17 01/24/2021 0855   CREATININE 0.72 01/24/2021 0855   CALCIUM 9.6 01/24/2021 0855   GFRNONAA >60 01/24/2021 0855   GFRAA >60 12/15/2017 0550   Lab Results  Component Value Date   HGBA1C 5.7 (H) 01/24/2021   No results found for: INSULIN  CBC    Component Value Date/Time   WBC 5.9 01/24/2021 0855   RBC 5.02 01/24/2021 0855   HGB 15.0 01/24/2021 0855   HCT 45.7 01/24/2021 0855   PLT 325 01/24/2021 0855   MCV 91.0 01/24/2021 0855   MCH 29.9 01/24/2021 0855   MCHC 32.8 01/24/2021 0855   RDW 14.1 01/24/2021 0855   Iron/TIBC/Ferritin/ %Sat No results found for: IRON, TIBC, FERRITIN, IRONPCTSAT Lipid Panel     Component Value Date/Time   CHOL 163 03/25/2018 0943   TRIG 108.0 03/25/2018 0943   HDL 47.50 03/25/2018 0943   CHOLHDL 3 03/25/2018 0943   VLDL 21.6 03/25/2018 0943   LDLCALC 94 03/25/2018 0943   Hepatic Function Panel     Component Value Date/Time   PROT 7.3 01/24/2021 0855   ALBUMIN 3.9 01/24/2021 0855   AST 19 01/24/2021 0855   ALT 23 01/24/2021 0855   ALKPHOS 47 01/24/2021 0855   BILITOT 1.4 (H) 01/24/2021 0855   BILIDIR 0.2 09/21/2013 0802      Component Value Date/Time   TSH 0.04 (  L) 06/15/2018 1359     Assessment and Plan   Complication of gastric banding  Primary hypertension  Spinal stenosis of lumbar region without neurogenic claudication  Gastroesophageal reflux disease, unspecified whether esophagitis present  Lapband APS October 2008 - removed Dec 2014.    Assessment and Plan Assessment & Plan         Obesity Treatment /  Action Plan:  {EMobesityactionplanscribe:28314::Patient will work on garnering support from family and friends to begin weight loss journey.,Will work on eliminating or reducing the presence of highly palatable, calorie dense foods in the home.,Will complete provided nutritional and psychosocial assessment questionnaire before the next appointment.,Will be scheduled for indirect calorimetry to determine resting energy expenditure in a fasting state.  This will allow us  to create a reduced calorie, high-protein meal plan to promote loss of fat mass while preserving muscle mass.,Counseled on the health benefits of losing 5%-15% of total body weight.,Was counseled on nutritional approaches to weight loss and benefits of reducing processed foods and consuming plant-based foods and high quality protein as part of nutritional weight management.,Was counseled on pharmacotherapy and role as an adjunct in weight management. }  Obesity Education Performed Today:  She was weighed on the bioimpedance scale and results were discussed and documented in the synopsis.  We discussed obesity as a disease and the importance of a more detailed evaluation of all the factors contributing to the disease.  We discussed the importance of long term lifestyle changes which include nutrition, exercise and behavioral modifications as well as the importance of customizing this to her specific health and social needs.  We discussed the benefits of reaching a healthier weight to alleviate the symptoms of existing conditions and reduce the risks of the biomechanical, metabolic and psychological effects of obesity.  We reviewed the four pillars of obesity medicine and importance of using a multimodal approach.  We reviewed the basic principles in weight management.   Adrien LITTIE Fair appears to be in the action stage of change and states they are ready to start intensive lifestyle modifications and behavioral  modifications.  I have spent *** minutes in the care of the patient today including: {NUMBER 1-10:22536} minutes before the visit reviewing and preparing the chart. *** minutes face-to-face {emfacetoface:32598::assessing and reviewing listed medical problems as outlined in obesity care plan,providing nutritional and behavioral counseling on topics outlined in the obesity care plan,independently interpreting test results and goals of care, as described in assessment and plan,reviewing and discussing biometric information and progress} {NUMBER 1-10:22536} minutes after the visit updating chart and documentation of encounter.  Reviewed by clinician on day of visit: allergies, medications, problem list, medical history, surgical history, family history, social history, and previous encounter notes pertinent to obesity diagnosis.  Rocklyn Mayberry,PA-C

## 2024-09-13 ENCOUNTER — Encounter (INDEPENDENT_AMBULATORY_CARE_PROVIDER_SITE_OTHER): Payer: Self-pay | Admitting: Physician Assistant

## 2024-09-13 ENCOUNTER — Ambulatory Visit (INDEPENDENT_AMBULATORY_CARE_PROVIDER_SITE_OTHER): Admitting: Physician Assistant

## 2024-09-13 VITALS — BP 121/73 | HR 58 | Temp 98.4°F | Ht 67.5 in | Wt 297.0 lb

## 2024-09-13 DIAGNOSIS — E039 Hypothyroidism, unspecified: Secondary | ICD-10-CM

## 2024-09-13 DIAGNOSIS — Z0289 Encounter for other administrative examinations: Secondary | ICD-10-CM

## 2024-09-13 DIAGNOSIS — Z8639 Personal history of other endocrine, nutritional and metabolic disease: Secondary | ICD-10-CM

## 2024-09-13 DIAGNOSIS — Z6841 Body Mass Index (BMI) 40.0 and over, adult: Secondary | ICD-10-CM

## 2024-09-13 DIAGNOSIS — K9509 Other complications of gastric band procedure: Secondary | ICD-10-CM

## 2024-09-13 DIAGNOSIS — M1712 Unilateral primary osteoarthritis, left knee: Secondary | ICD-10-CM

## 2024-09-13 DIAGNOSIS — I10 Essential (primary) hypertension: Secondary | ICD-10-CM

## 2024-09-13 DIAGNOSIS — N3281 Overactive bladder: Secondary | ICD-10-CM | POA: Diagnosis not present

## 2024-09-13 DIAGNOSIS — Z96619 Presence of unspecified artificial shoulder joint: Secondary | ICD-10-CM

## 2024-09-13 DIAGNOSIS — Z9884 Bariatric surgery status: Secondary | ICD-10-CM

## 2024-09-13 DIAGNOSIS — M48061 Spinal stenosis, lumbar region without neurogenic claudication: Secondary | ICD-10-CM

## 2024-09-13 DIAGNOSIS — K219 Gastro-esophageal reflux disease without esophagitis: Secondary | ICD-10-CM | POA: Diagnosis not present

## 2024-09-13 DIAGNOSIS — E66813 Obesity, class 3: Secondary | ICD-10-CM

## 2024-09-13 DIAGNOSIS — Z96651 Presence of right artificial knee joint: Secondary | ICD-10-CM

## 2024-09-27 ENCOUNTER — Encounter (INDEPENDENT_AMBULATORY_CARE_PROVIDER_SITE_OTHER): Payer: Self-pay | Admitting: Family Medicine

## 2024-09-27 ENCOUNTER — Ambulatory Visit (INDEPENDENT_AMBULATORY_CARE_PROVIDER_SITE_OTHER): Admitting: Family Medicine

## 2024-09-27 VITALS — BP 127/77 | HR 71 | Temp 97.4°F | Ht 68.0 in | Wt 298.0 lb

## 2024-09-27 DIAGNOSIS — E1165 Type 2 diabetes mellitus with hyperglycemia: Secondary | ICD-10-CM

## 2024-09-27 DIAGNOSIS — E538 Deficiency of other specified B group vitamins: Secondary | ICD-10-CM

## 2024-09-27 DIAGNOSIS — R5383 Other fatigue: Secondary | ICD-10-CM

## 2024-09-27 DIAGNOSIS — N3289 Other specified disorders of bladder: Secondary | ICD-10-CM

## 2024-09-27 DIAGNOSIS — Z6841 Body Mass Index (BMI) 40.0 and over, adult: Secondary | ICD-10-CM

## 2024-09-27 DIAGNOSIS — R0602 Shortness of breath: Secondary | ICD-10-CM | POA: Diagnosis not present

## 2024-09-27 DIAGNOSIS — E039 Hypothyroidism, unspecified: Secondary | ICD-10-CM | POA: Diagnosis not present

## 2024-09-27 DIAGNOSIS — I10 Essential (primary) hypertension: Secondary | ICD-10-CM

## 2024-09-27 DIAGNOSIS — E559 Vitamin D deficiency, unspecified: Secondary | ICD-10-CM

## 2024-09-27 DIAGNOSIS — M1712 Unilateral primary osteoarthritis, left knee: Secondary | ICD-10-CM

## 2024-09-27 DIAGNOSIS — Z1331 Encounter for screening for depression: Secondary | ICD-10-CM

## 2024-09-27 DIAGNOSIS — Z7985 Long-term (current) use of injectable non-insulin antidiabetic drugs: Secondary | ICD-10-CM

## 2024-09-27 DIAGNOSIS — F321 Major depressive disorder, single episode, moderate: Secondary | ICD-10-CM

## 2024-09-27 NOTE — Progress Notes (Unsigned)
 Chief Complaint:  Obesity   Subjective:  Kayla [name] (MR# [id number]) is a 68 y.o. female who presents for evaluation and treatment of obesity and related comorbidities.   [place]la [name] is currently in the action stage of change and ready to dedicate time achieving and maintaining a healthier weight. Kayla [name] is interested in becoming our patient and working on intensive lifestyle modifications including (but not limited to) diet and exercise for weight loss.  Kayla [name] has been struggling with her weight. She has been unsuccessful in either losing weight, maintaining weight loss, or reaching her healthy weight goal.  She has been on keto for 2 years.  On Mounjaro  for 2 years.  Patient has allergy to shellfish.  Had a history of adjustable gastric banding and removal- had it placed for about a year.  Removal due to slipping to be around esophagus.  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  She is planning to retire at the end of next year.  She works 40 hours a week: 7am-5p/6pm.  She works from [name] occasionally.  Lives at [name] with her husband [name].  They eat most meals together and isn't anticipating any sabotage.  She belongs to [employer] gaining weight after hysterectomy and when she was on fertility drugs.  She is needing a left knee replacement and needs to lose weight to get to BMI of 40.  She previously lost weight (50-60lbs but regained), and keto diet.  She is the grocery shopper and most of the time she is the cook of the family. Doesn't like breakfast but will have atkins protein drink.  Dislikes anything creamy or fried.    Food Recall: Atkins protein drink in the am- this is thirst related.  Around 10:30/11am she will have low sodium boars head chicken, turkey (2-3 thin slices), tomato, lettuce and mayo on keto bread.  If she wants something crunchy will have almonds or pecans (1/4 cup).  Feels satisfied.  Drinks diet coke or unsweetened ice tea.  Sometimes she will have another  protein drink around 3/3:30pm.  Otherwise she may have dinner around 5 and do grilled chicken, broccoli and a slice of keto bread (ranges from 3-4.5 oz), 1-2 cups vegetables.  Feels full and sometimes she has to force herself to eat chicken and vegetables.   Indirect Calorimeter completed today shows a RMR: 1958.  Her calculated basal metabolic rate is 8079 thus her basal metabolic rate is better than expected.  Other Fatigue [name] denies daytime somnolence and denies waking up still tired. Patient has no history of symptoms of OSA. Kayla [name] generally gets 8 or 9 hours of sleep per night, and states that she has generally restful sleep. Snoring is present. Apneic episodes are present. Epworth Sleepiness Score is 3.   Shortness of Breath Kayla [name] notes increasing shortness of breath with exercising and seems to be worsening over time with weight gain. She notes getting out of breath sooner with activity than she used to. This has not gotten worse recently. Kayla [name] denies shortness of breath at rest or orthopnea.  Depression Screen Kayla [name]'s Food and Mood (modified PHQ-9) score was 13.     09/27/2024   11:45 AM  Depression screen PHQ 2/9  Decreased Interest 1  Down, Depressed, Hopeless 1  PHQ - 2 Score 2  Altered sleeping 1  Tired, decreased energy 1  Change in appetite 1  Feeling bad or failure about yourself  0  Trouble concentrating 0  Moving slowly or fidgety/restless 0  Suicidal thoughts 0  PHQ-9 Score 5  Difficult doing work/chores Not difficult at all     Objective:  Vitals Temp: (!) 97.4 F (36.3 C) BP: 127/77 Pulse Rate: 71 SpO2: 97 %   Anthropometric Measurements Height: 5' 8 (1.727 m) Weight: 298 lb (135.2 kg) BMI (Calculated): 45.32 Starting Weight: 297 lb Peak Weight: 388 lb Waist Measurement : 47 inches   Body Composition  Body Fat %: 56.2 % Fat Mass (lbs): 167.6 lbs Muscle Mass (lbs): 124 lbs Visceral Fat Rating : 21   Other Clinical Data RMR:  1958 Fasting: yes Labs: yes Today's Visit #: 1 Starting Date: 09/27/24 Comments: 1    EKG: Normal sinus rhythm, rate 71.  General: Cooperative, alert, well developed, in no acute distress. HEENT: Conjunctivae and lids unremarkable. Cardiovascular: Regular rhythm.  Lungs: Normal work of breathing. Neurologic: No focal deficits.   Lab Results  Component Value Date   CREATININE 0.72 01/24/2021   BUN 17 01/24/2021   NA 136 01/24/2021   K 3.8 01/24/2021   CL 105 01/24/2021   CO2 22 01/24/2021   Lab Results  Component Value Date   ALT 23 01/24/2021   AST 19 01/24/2021   ALKPHOS 47 01/24/2021   BILITOT 1.4 (H) 01/24/2021   Lab Results  Component Value Date   HGBA1C 5.7 (H) 01/24/2021   No results found for: INSULIN  Lab Results  Component Value Date   TSH 0.04 (L) 06/15/2018   Lab Results  Component Value Date   CHOL 163 03/25/2018   HDL 47.50 03/25/2018   LDLCALC 94 03/25/2018   TRIG 108.0 03/25/2018   CHOLHDL 3 03/25/2018   Lab Results  Component Value Date   WBC 5.9 01/24/2021   HGB 15.0 01/24/2021   HCT 45.7 01/24/2021   MCV 91.0 01/24/2021   PLT 325 01/24/2021   No results found for: IRON, TIBC, FERRITIN  Assessment and Plan:  Assessment & Plan Other fatigue  SOBOE (shortness of breath on exertion)  Depression screening  Primary osteoarthritis of left knee  Primary hypertension  Acquired hypothyroidism  Bladder spasms On myrbetriq for issues with incontinence.  This is a new issue patient has experienced.  Discussed possibility of pelvic floor physical therapy in addition to myrbetriq.  Will follow up on this referral option at next appointment. Type 2 diabetes mellitus with hyperglycemia, without long-term current use of insulin  [name]) Patient mentions she has been on keto for 2 years and she likes carbohydrates.   Vitamin D deficiency  Vitamin B12 deficiency  Depression, major, single episode, moderate (HCC) Patient's son died  12/21/2025in 2022/10/23 from fentanyl  overdose in marijuana.  Holidays are very difficult for her.  She sees [facility], Operational, paramedic at [place].   BMI 45.0-49.9, adult [facility])  Morbid obesity (HCC)    Other Fatigue  [name] does feel that her weight is causing her energy to be lower than it should be. Fatigue may be related to obesity, depression or many other causes. Labs will be ordered, and in the meanwhile, Gussie will focus on self care including making healthy food choices, increasing physical activity and focusing on stress reduction.  Shortness of Breath  [name] does feel that she gets out of breath more easily that she used to when she exercises. [name]'s shortness of breath appears to be obesity related and exercise induced. She has agreed to work on weight loss and gradually increase exercise to treat her exercise induced shortness of breath. Will continue to monitor closely.  Problem List Items Addressed This Visit   None Visit Diagnoses       Other fatigue    -  Primary   Relevant Orders   EKG 12-Lead     SOBOE (shortness of breath on exertion)       Relevant Orders   EKG 12-Lead       [name] is currently in the action stage of change and her goal is to continue with weight loss efforts. I recommend Kayla [name] begin the structured treatment plan as follows:  She has agreed to Category 3 Plan  Exercise goals: Older adults should determine their level of effort for physical activity relative to their level of fitness.  Behavioral modification strategies:increasing lean protein intake, decreasing simple carbohydrates, increasing vegetables, meal planning and cooking strategies, and planning for success  She was informed of the importance of frequent follow-up visits to maximize her success with intensive lifestyle modifications for her multiple health conditions. She was informed we would discuss her lab results at her next visit unless there is a critical issue that needs to be addressed  sooner. Kayla [name] agreed to keep her next visit at the agreed upon time to discuss these results.  Labs ordered with plans to discuss at the next visit.   Attestation Statements:  Reviewed by clinician on day of visit: allergies, medications, problem list, medical history, surgical history, family history, social history, and previous encounter notes.  This is the patient's first visit at [facility]. The patient's NEW PATIENT PACKET was reviewed at length. Included in the packet: current and past health history, medications, allergies, ROS, gynecologic history (women only), surgical history, family history, social history, weight history, weight loss surgery history (for those that have had weight loss surgery), nutritional evaluation, mood and food questionnaire, PHQ9, Epworth questionnaire, sleep habits questionnaire, patient life and health improvement goals questionnaire. These will all be scanned into the patient's chart under media.   During the visit, I independently reviewed the patient's EKG, bioimpedance scale results, and indirect calorimeter results. I used this information to tailor a meal plan for the patient that will help her to lose weight and will improve her obesity-related conditions going forward. I performed a medically necessary appropriate examination and/or evaluation. I discussed the assessment and treatment plan with the patient. The patient was provided an opportunity to ask questions and all were answered. The patient agreed with the plan and demonstrated an understanding of the instructions. Labs were ordered at this visit and will be reviewed at the next visit unless more critical results need to be addressed immediately. Clinical information was updated and documented in the EMR.    [name], MD

## 2024-09-28 LAB — CBC WITH DIFFERENTIAL/PLATELET
Basophils Absolute: 0 x10E3/uL (ref 0.0–0.2)
Basos: 1 %
EOS (ABSOLUTE): 0.2 x10E3/uL (ref 0.0–0.4)
Eos: 3 %
Hematocrit: 45.5 % (ref 34.0–46.6)
Hemoglobin: 14.6 g/dL (ref 11.1–15.9)
Immature Grans (Abs): 0 x10E3/uL (ref 0.0–0.1)
Immature Granulocytes: 0 %
Lymphocytes Absolute: 1.5 x10E3/uL (ref 0.7–3.1)
Lymphs: 25 %
MCH: 30.3 pg (ref 26.6–33.0)
MCHC: 32.1 g/dL (ref 31.5–35.7)
MCV: 94 fL (ref 79–97)
Monocytes Absolute: 0.5 x10E3/uL (ref 0.1–0.9)
Monocytes: 8 %
Neutrophils Absolute: 3.6 x10E3/uL (ref 1.4–7.0)
Neutrophils: 63 %
Platelets: 315 x10E3/uL (ref 150–450)
RBC: 4.82 x10E6/uL (ref 3.77–5.28)
RDW: 13.1 % (ref 11.7–15.4)
WBC: 5.7 x10E3/uL (ref 3.4–10.8)

## 2024-09-28 LAB — COMPREHENSIVE METABOLIC PANEL WITH GFR
ALT: 18 IU/L (ref 0–32)
AST: 20 IU/L (ref 0–40)
Albumin: 4.3 g/dL (ref 3.9–4.9)
Alkaline Phosphatase: 59 IU/L (ref 49–135)
BUN/Creatinine Ratio: 33 — ABNORMAL HIGH (ref 12–28)
BUN: 24 mg/dL (ref 8–27)
Bilirubin Total: 1.1 mg/dL (ref 0.0–1.2)
CO2: 22 mmol/L (ref 20–29)
Calcium: 10 mg/dL (ref 8.7–10.3)
Chloride: 105 mmol/L (ref 96–106)
Creatinine, Ser: 0.73 mg/dL (ref 0.57–1.00)
Globulin, Total: 2.4 g/dL (ref 1.5–4.5)
Glucose: 81 mg/dL (ref 70–99)
Potassium: 4.4 mmol/L (ref 3.5–5.2)
Sodium: 139 mmol/L (ref 134–144)
Total Protein: 6.7 g/dL (ref 6.0–8.5)
eGFR: 90 mL/min/1.73 (ref >59)

## 2024-09-28 LAB — VITAMIN B12: Vitamin B-12: 764 pg/mL (ref 232–1245)

## 2024-09-28 LAB — LIPID PANEL WITH LDL/HDL RATIO
Cholesterol, Total: 181 mg/dL (ref 100–199)
HDL: 69 mg/dL (ref >39)
LDL Chol Calc (NIH): 96 mg/dL (ref 0–99)
LDL/HDL Ratio: 1.4 ratio (ref 0.0–3.2)
Triglycerides: 89 mg/dL (ref 0–149)
VLDL Cholesterol Cal: 16 mg/dL (ref 5–40)

## 2024-09-28 LAB — HEMOGLOBIN A1C
Est. average glucose Bld gHb Est-mCnc: 100 mg/dL
Hgb A1c MFr Bld: 5.1 % (ref 4.8–5.6)

## 2024-09-28 LAB — T3: T3, Total: 100 ng/dL (ref 71–180)

## 2024-09-28 LAB — TSH: TSH: 0.92 u[IU]/mL (ref 0.450–4.500)

## 2024-09-28 LAB — INSULIN, RANDOM: INSULIN: 16.2 u[IU]/mL (ref 2.6–24.9)

## 2024-09-28 LAB — T4, FREE: Free T4: 1.45 ng/dL (ref 0.82–1.77)

## 2024-09-28 LAB — VITAMIN D 25 HYDROXY (VIT D DEFICIENCY, FRACTURES): Vit D, 25-Hydroxy: 32.2 ng/mL (ref 30.0–100.0)

## 2024-10-09 NOTE — Assessment & Plan Note (Signed)
 Patient is on levothyroxine 112 mcg daily.  No cold intolerance, heat intolerance, or palpitations reported.  Thyroid  function test ordered today.

## 2024-10-09 NOTE — Assessment & Plan Note (Signed)
 Blood pressure well-controlled today.  EKG performed with no concerns.  CMP today.

## 2024-10-11 ENCOUNTER — Ambulatory Visit (INDEPENDENT_AMBULATORY_CARE_PROVIDER_SITE_OTHER): Admitting: Family Medicine

## 2024-10-11 ENCOUNTER — Encounter (INDEPENDENT_AMBULATORY_CARE_PROVIDER_SITE_OTHER): Payer: Self-pay | Admitting: Family Medicine

## 2024-10-11 VITALS — BP 122/75 | HR 72 | Temp 97.7°F | Ht 68.0 in | Wt 302.0 lb

## 2024-10-11 DIAGNOSIS — E559 Vitamin D deficiency, unspecified: Secondary | ICD-10-CM | POA: Diagnosis not present

## 2024-10-11 DIAGNOSIS — Z6841 Body Mass Index (BMI) 40.0 and over, adult: Secondary | ICD-10-CM | POA: Diagnosis not present

## 2024-10-11 DIAGNOSIS — E1165 Type 2 diabetes mellitus with hyperglycemia: Secondary | ICD-10-CM

## 2024-10-11 DIAGNOSIS — Z7985 Long-term (current) use of injectable non-insulin antidiabetic drugs: Secondary | ICD-10-CM | POA: Diagnosis not present

## 2024-10-11 MED ORDER — VITAMIN D (ERGOCALCIFEROL) 1.25 MG (50000 UNIT) PO CAPS: 50000.0000 [IU] | ORAL_CAPSULE | ORAL | 0 refills | Status: AC

## 2024-10-11 NOTE — Progress Notes (Signed)
 "  SUBJECTIVE:  Chief Complaint: Obesity  Interim History: Patient mentions she enjoyed the variety of the plan and being able to have more food than she was previously.  She over extended her shoulder and mentions did something to her rotator cuff and then got put on prednisone for the inflammation.  She noticed while on prednisone she had more cravings and took in more sweets and developed swelling.  She did notice she felt better on the meal plan than she did previously.  Off prednisone for the last two days.  She felt the food quantity was more than she ate previously. She is leaving to go to [name] on [place] but has no parties or events planned.  She has been eating yogurt in the evening and having that with the sugar free jelly and noticed improvement in her stomach symptoms.   Kayla [name] is here to discuss her progress with her obesity treatment plan. She is on the Category 3 Plan and keto and states she is following her eating plan approximately 70-75 % of the time. She states she is exercising 45 minutes 3-4 times per week.   OBJECTIVE: Visit Diagnoses: Problem List Items Addressed This Visit   None Visit Diagnoses       Type 2 diabetes mellitus with hyperglycemia, without long-term current use of insulin  (HCC)    -  Primary     Vitamin D  deficiency       Relevant Medications   Vitamin D , Ergocalciferol , (DRISDOL ) 1.25 MG (50000 UNIT) CAPS capsule     BMI 45.0-49.9, adult (HCC)         Morbid obesity (HCC)           Vitals Temp: 97.7 F (36.5 C) BP: 122/75 Pulse Rate: 72 SpO2: 98 %   Anthropometric Measurements Height: 5' 8 (1.727 m) Weight: (!) 302 lb (137 kg) BMI (Calculated): 45.93 Weight at Last Visit: 298 lb Weight Lost Since Last Visit: 0 Weight Gained Since Last Visit: 4 Starting Weight: 298 lb Total Weight Loss (lbs): 0 lb (0 kg)   Body Composition  Body Fat %: 57.3 % Fat Mass (lbs): 173 lbs Muscle Mass (lbs): 122.4 lbs Total Body Water (lbs):  21 lbs   Other Clinical Data Today's Visit #: 2 Starting Date: 09/27/24 Comments: Cat 3, keto     ASSESSMENT AND PLAN: Assessment & Plan Type 2 diabetes mellitus with hyperglycemia, without long-term current use of insulin  (HCC) Pathophysiology of progression through insulin  resistance to prediabetes and diabetes was discussed at length today.  Patient to continue to monitor and be in control of total intake of snack calories which may be simple carbohydrates but should be consumed only after the patient has taken in all the nutrition for the day.  Macronutrient identification, classification and daily intake ratios were discussed.  Plan to repeat labs in 3 months to monitor both hemoglobin A1c and insulin  levels.  Patient is on mounjaro  and has been mindful of her food intake and focusing on trying to increase her total intake with higher protein intake.    Vitamin D  deficiency Discussed importance of vitamin d  supplementation.  Vitamin d  supplementation has been shown to decrease fatigue, decrease risk of progression to insulin  resistance and then prediabetes, decreases risk of falling in older age and can even assist in decreasing depressive symptoms in PTSD.   Prescription for Vitamin D  sent in.   BMI 45.0-49.9, adult [facility])  Morbid obesity (HCC)    Diet: Kayla [name] is currently in  the action stage of change. As such, her goal is to continue with weight loss efforts and has agreed to the Category 3 Plan.   Exercise:  Older adults should determine their level of effort for physical activity relative to their level of fitness.  Behavior Modification:  We discussed the following Behavioral Modification Strategies today: increasing lean protein intake, decreasing simple carbohydrates, meal planning and cooking strategies, celebration eating strategies, and planning for success.   Return in about 3 weeks (around 11/01/2024).   She was informed of the importance of frequent follow up  visits to maximize her success with intensive lifestyle modifications for her multiple health conditions.  Attestation Statements:   Reviewed by clinician on day of visit: allergies, medications, problem list, medical history, surgical history, family history, social history, and previous encounter notes.   [name], MD "

## 2024-11-15 ENCOUNTER — Encounter (INDEPENDENT_AMBULATORY_CARE_PROVIDER_SITE_OTHER): Payer: Self-pay | Admitting: Family Medicine

## 2024-11-15 ENCOUNTER — Ambulatory Visit (INDEPENDENT_AMBULATORY_CARE_PROVIDER_SITE_OTHER): Admitting: Family Medicine

## 2024-11-15 VITALS — BP 110/76 | HR 79 | Temp 97.7°F | Ht 68.0 in | Wt 294.0 lb

## 2024-11-15 DIAGNOSIS — Z6841 Body Mass Index (BMI) 40.0 and over, adult: Secondary | ICD-10-CM

## 2024-11-15 DIAGNOSIS — Z7985 Long-term (current) use of injectable non-insulin antidiabetic drugs: Secondary | ICD-10-CM

## 2024-11-15 DIAGNOSIS — E1165 Type 2 diabetes mellitus with hyperglycemia: Secondary | ICD-10-CM | POA: Diagnosis not present

## 2024-11-15 DIAGNOSIS — E559 Vitamin D deficiency, unspecified: Secondary | ICD-10-CM

## 2024-11-15 NOTE — Progress Notes (Signed)
" ° °  SUBJECTIVE:  Chief Complaint: Obesity  Interim History: Overall patient had a good holiday season- she went to [place] for her birthday and did have an indulgence of birthday cake and a gin and tonic.  She is craving sweets more frequently on the meal plan.  She will be in town the next few weeks.  She notices that without a significant carb intake she is colder more frequently.  She has the flu during the holidays and mentions that she was ot able to go to the pool to exercise.  She feels better overall and has more energy and her disposition is better with a bigger variety.  She will eat yogurt, protein drink or pork rinds for snack calories.  [name] is here to discuss her progress with her obesity treatment plan. She is on the Category 3 Plan and states she is following her eating plan approximately 90-95 % of the time. She states she is exercising 30-45 minutes 3 times per week.   OBJECTIVE: Visit Diagnoses: Problem List Items Addressed This Visit   None Visit Diagnoses       Type 2 diabetes mellitus with hyperglycemia, without long-term current use of insulin  (HCC)    -  Primary     Vitamin D  deficiency         BMI 40.0-44.9, adult (HCC)         Morbid obesity (HCC)           Vitals Temp: 97.7 F (36.5 C) BP: 110/76 Pulse Rate: 79 SpO2: 97 %   Anthropometric Measurements Height: 5' 8 (1.727 m) Weight: 294 lb (133.4 kg) BMI (Calculated): 44.71 Weight at Last Visit: 302 lb Weight Lost Since Last Visit: 8 lb Weight Gained Since Last Visit: 0 Starting Weight: 4 lb   Body Composition  Body Fat %: 55.4 % Fat Mass (lbs): 163.2 lbs Muscle Mass (lbs): 124.8 lbs Visceral Fat Rating : 20   Other Clinical Data Today's Visit #: 3 Starting Date: 09/27/24 Comments: Cat 3     ASSESSMENT AND PLAN: Assessment & Plan Type 2 diabetes mellitus with hyperglycemia, without long-term current use of insulin  (HCC) On mounjaro  weekly without any significant GI side  effects.  A1c and Insulin  on initial labs showing controlled A1c and slightly elevated insulin  level.  Continue Mounjaro  at current dose with no refill needed. Vitamin D  deficiency Noticing improvement in mood since starting prescription strength Vitamin D .  No nausea, vomiting or muscle weakness.  Does not need a refill today. BMI 40.0-44.9, adult [facility])  Morbid obesity (HCC)    Diet: [name] is currently in the action stage of change. As such, her goal is to continue with weight loss efforts and has agreed to the Category 3 Plan.   Exercise:  Older adults should determine their level of effort for physical activity relative to their level of fitness.  Behavior Modification:  We discussed the following Behavioral Modification Strategies today: increasing lean protein intake, decreasing simple carbohydrates, increasing vegetables, meal planning and cooking strategies, keeping healthy foods in the home, and planning for success.  Return in about 4 weeks (around 12/13/2024).   She was informed of the importance of frequent follow up visits to maximize her success with intensive lifestyle modifications for her multiple health conditions.  Attestation Statements:   Reviewed by clinician on day of visit: allergies, medications, problem list, medical history, surgical history, family history, social history, and previous encounter notes.     Kayla [place], MD "

## 2024-12-06 ENCOUNTER — Ambulatory Visit (INDEPENDENT_AMBULATORY_CARE_PROVIDER_SITE_OTHER): Admitting: Family Medicine

## 2025-01-03 ENCOUNTER — Ambulatory Visit (INDEPENDENT_AMBULATORY_CARE_PROVIDER_SITE_OTHER): Admitting: Family Medicine
